# Patient Record
Sex: Male | Born: 1945 | Race: White | Hispanic: No | Marital: Married | Smoking: Never smoker
Health system: Southern US, Community
[De-identification: ages and names within clinical notes are randomized; demographics above are authoritative.]

## PROBLEM LIST (undated history)

## (undated) DIAGNOSIS — F419 Anxiety disorder, unspecified: Secondary | ICD-10-CM

## (undated) DIAGNOSIS — M109 Gout, unspecified: Secondary | ICD-10-CM

## (undated) DIAGNOSIS — H8110 Benign paroxysmal vertigo, unspecified ear: Secondary | ICD-10-CM

## (undated) DIAGNOSIS — D432 Neoplasm of uncertain behavior of brain, unspecified: Secondary | ICD-10-CM

## (undated) DIAGNOSIS — G4730 Sleep apnea, unspecified: Secondary | ICD-10-CM

## (undated) DIAGNOSIS — D496 Neoplasm of unspecified behavior of brain: Secondary | ICD-10-CM

## (undated) DIAGNOSIS — I10 Essential (primary) hypertension: Secondary | ICD-10-CM

## (undated) DIAGNOSIS — F32A Depression, unspecified: Secondary | ICD-10-CM

## (undated) DIAGNOSIS — F329 Major depressive disorder, single episode, unspecified: Secondary | ICD-10-CM

## (undated) HISTORY — DX: Benign paroxysmal vertigo, unspecified ear: H81.10

## (undated) HISTORY — PX: KNEE ARTHROSCOPY: SHX127

## (undated) HISTORY — PX: BRAIN SURGERY: SHX531

## (undated) HISTORY — PX: JOINT REPLACEMENT: SHX530

## (undated) HISTORY — DX: Neoplasm of uncertain behavior of brain, unspecified: D43.2

## (undated) HISTORY — DX: Neoplasm of unspecified behavior of brain: D49.6

---

## 2000-11-10 ENCOUNTER — Ambulatory Visit (HOSPITAL_COMMUNITY): Admission: RE | Admit: 2000-11-10 | Discharge: 2000-11-10 | Payer: Self-pay | Admitting: Neurological Surgery

## 2000-11-10 ENCOUNTER — Encounter: Payer: Self-pay | Admitting: Neurological Surgery

## 2001-05-25 ENCOUNTER — Ambulatory Visit (HOSPITAL_COMMUNITY): Admission: RE | Admit: 2001-05-25 | Discharge: 2001-05-25 | Payer: Self-pay | Admitting: Family Medicine

## 2001-05-25 ENCOUNTER — Encounter: Payer: Self-pay | Admitting: Family Medicine

## 2002-02-25 ENCOUNTER — Encounter: Admission: RE | Admit: 2002-02-25 | Discharge: 2002-02-25 | Payer: Self-pay | Admitting: Orthopedic Surgery

## 2002-02-25 ENCOUNTER — Encounter: Payer: Self-pay | Admitting: Orthopedic Surgery

## 2002-11-12 ENCOUNTER — Ambulatory Visit (HOSPITAL_COMMUNITY): Admission: RE | Admit: 2002-11-12 | Discharge: 2002-11-12 | Payer: Self-pay | Admitting: Neurological Surgery

## 2003-02-03 ENCOUNTER — Encounter: Admission: RE | Admit: 2003-02-03 | Discharge: 2003-02-03 | Payer: Self-pay | Admitting: Orthopedic Surgery

## 2003-03-13 ENCOUNTER — Inpatient Hospital Stay (HOSPITAL_COMMUNITY): Admission: RE | Admit: 2003-03-13 | Discharge: 2003-03-15 | Payer: Self-pay | Admitting: Orthopedic Surgery

## 2003-06-20 ENCOUNTER — Ambulatory Visit (HOSPITAL_COMMUNITY): Admission: RE | Admit: 2003-06-20 | Discharge: 2003-06-20 | Payer: Self-pay | Admitting: Sports Medicine

## 2005-04-17 ENCOUNTER — Emergency Department (HOSPITAL_COMMUNITY): Admission: EM | Admit: 2005-04-17 | Discharge: 2005-04-17 | Payer: Self-pay | Admitting: Emergency Medicine

## 2005-06-13 ENCOUNTER — Ambulatory Visit (HOSPITAL_COMMUNITY): Admission: RE | Admit: 2005-06-13 | Discharge: 2005-06-13 | Payer: Self-pay | Admitting: Neurological Surgery

## 2006-10-25 ENCOUNTER — Ambulatory Visit (HOSPITAL_COMMUNITY): Admission: RE | Admit: 2006-10-25 | Discharge: 2006-10-25 | Payer: Self-pay | Admitting: General Surgery

## 2007-02-08 ENCOUNTER — Ambulatory Visit (HOSPITAL_BASED_OUTPATIENT_CLINIC_OR_DEPARTMENT_OTHER): Admission: RE | Admit: 2007-02-08 | Discharge: 2007-02-08 | Payer: Self-pay | Admitting: Orthopedic Surgery

## 2009-01-21 ENCOUNTER — Encounter (HOSPITAL_COMMUNITY): Admission: RE | Admit: 2009-01-21 | Discharge: 2009-02-20 | Payer: Self-pay | Admitting: Oncology

## 2009-01-21 ENCOUNTER — Ambulatory Visit (HOSPITAL_COMMUNITY): Payer: Self-pay | Admitting: Oncology

## 2009-03-16 ENCOUNTER — Ambulatory Visit (HOSPITAL_COMMUNITY): Payer: Self-pay | Admitting: Oncology

## 2009-03-16 ENCOUNTER — Encounter (HOSPITAL_COMMUNITY): Admission: RE | Admit: 2009-03-16 | Discharge: 2009-04-15 | Payer: Self-pay | Admitting: Oncology

## 2009-10-27 ENCOUNTER — Ambulatory Visit (HOSPITAL_COMMUNITY): Payer: Self-pay | Admitting: Oncology

## 2009-10-27 ENCOUNTER — Encounter (HOSPITAL_COMMUNITY)
Admission: RE | Admit: 2009-10-27 | Discharge: 2009-11-26 | Payer: Self-pay | Source: Home / Self Care | Admitting: Oncology

## 2010-03-17 LAB — TESTOSTERONE: Testosterone: 178.47 ng/dL — ABNORMAL LOW (ref 250–890)

## 2010-03-17 LAB — DIFFERENTIAL
Basophils Absolute: 0 10*3/uL (ref 0.0–0.1)
Basophils Relative: 1 % (ref 0–1)
Eosinophils Absolute: 0.2 10*3/uL (ref 0.0–0.7)
Lymphocytes Relative: 28 % (ref 12–46)
Lymphs Abs: 2.4 10*3/uL (ref 0.7–4.0)
Monocytes Relative: 7 % (ref 3–12)
Neutro Abs: 5.3 10*3/uL (ref 1.7–7.7)

## 2010-03-17 LAB — CBC
HCT: 43 % (ref 39.0–52.0)
Hemoglobin: 14.5 g/dL (ref 13.0–17.0)
RDW: 13.6 % (ref 11.5–15.5)

## 2010-03-17 LAB — LIPID PANEL
Cholesterol: 169 mg/dL (ref 0–200)
LDL Cholesterol: 116 mg/dL — ABNORMAL HIGH (ref 0–99)

## 2010-03-17 LAB — TESTOSTERONE, % FREE: Testosterone-% Free: 2.2 % — ABNORMAL HIGH (ref 1.6–2.9)

## 2010-03-19 ENCOUNTER — Other Ambulatory Visit (HOSPITAL_COMMUNITY): Payer: Self-pay | Admitting: Internal Medicine

## 2010-03-19 ENCOUNTER — Ambulatory Visit (HOSPITAL_COMMUNITY)
Admission: RE | Admit: 2010-03-19 | Discharge: 2010-03-19 | Disposition: A | Discharge status: Home / Self Care | Payer: BC Managed Care – PPO | Source: Ambulatory Visit | Attending: Internal Medicine | Admitting: Internal Medicine

## 2010-03-19 DIAGNOSIS — R609 Edema, unspecified: Secondary | ICD-10-CM

## 2010-03-19 DIAGNOSIS — M79609 Pain in unspecified limb: Secondary | ICD-10-CM | POA: Insufficient documentation

## 2010-03-19 DIAGNOSIS — M79675 Pain in left toe(s): Secondary | ICD-10-CM

## 2010-03-19 DIAGNOSIS — M7989 Other specified soft tissue disorders: Secondary | ICD-10-CM | POA: Insufficient documentation

## 2010-03-22 LAB — DIFFERENTIAL
Basophils Absolute: 0.1 10*3/uL (ref 0.0–0.1)
Basophils Relative: 1 % (ref 0–1)
Eosinophils Absolute: 0.4 10*3/uL (ref 0.0–0.7)
Lymphs Abs: 2.9 10*3/uL (ref 0.7–4.0)
Monocytes Absolute: 0.7 10*3/uL (ref 0.1–1.0)
Neutro Abs: 6.6 10*3/uL (ref 1.7–7.7)

## 2010-03-22 LAB — CBC
MCV: 89.9 fL (ref 78.0–100.0)
Platelets: 223 10*3/uL (ref 150–400)
RBC: 4.57 MIL/uL (ref 4.22–5.81)
WBC: 10.7 10*3/uL — ABNORMAL HIGH (ref 4.0–10.5)

## 2010-03-29 LAB — DIFFERENTIAL
Basophils Absolute: 0 10*3/uL (ref 0.0–0.1)
Basophils Relative: 0 % (ref 0–1)
Lymphocytes Relative: 20 % (ref 12–46)
Monocytes Absolute: 0.8 10*3/uL (ref 0.1–1.0)
Neutro Abs: 9.6 10*3/uL — ABNORMAL HIGH (ref 1.7–7.7)

## 2010-03-29 LAB — CBC
HCT: 41.4 % (ref 39.0–52.0)
RDW: 13.9 % (ref 11.5–15.5)
WBC: 13.4 10*3/uL — ABNORMAL HIGH (ref 4.0–10.5)

## 2010-05-18 NOTE — H&P (Signed)
Mark Bowers, Mark Bowers              ACCOUNT NO.:  [id number]   MEDICAL RECORD NO.:  [id number]           PATIENT TYPE:  AMB   LOCATION:  DAY                           FACILITY:  [facility]   PHYSICIAN:  Dalia Heading, M.D.  DATE OF BIRTH:  1945-08-24   DATE OF ADMISSION:  10/25/2006  DATE OF DISCHARGE:  LH                              HISTORY & PHYSICAL   CHIEF COMPLAINT:  Need for screening colonoscopy.   HISTORY OF PRESENT ILLNESS:  The patient is a 65 year old white male  who is referred for endoscopic evaluation.  He needs a colonoscopy for  screening purposes.  No abdominal pain, weight loss, nausea, vomiting,  diarrhea, constipation, melena or hematochezia have been noted.  He has  never had a colonoscopy.  There is no family history of colon carcinoma.   PAST MEDICAL HISTORY:  1. Depression.  2. Hypertension.   PAST SURGICAL HISTORY:  1. Knee surgery.  2. Brain surgery.  3. Right hip surgery.   CURRENT MEDICATIONS:  Effexor, aldactone, Crestor.   ALLERGIES:  PENICILLIN, SULFA.   REVIEW OF SYSTEMS:  The patient denies smoking.  He drinks alcohol  socially.  No other cardiopulmonary difficulties or bleeding disorders  are noted.   PHYSICAL EXAMINATION:  The patient is a well developed, well nourished  white male in no acute distress.  LUNGS:  Clear to auscultation with equal breath sounds bilaterally.  HEART:  Examination reveals a regular rate and rhythm, without S3, S4 or  murmurs.  ABDOMEN:  Soft, nontender, nondistended.  No hepatosplenomegaly or  masses are noted.  RECTAL:  Examination was deferred to the procedure.   IMPRESSION:  Need for screening colonoscopy.   PLAN:  The patient is scheduled for a colonoscopy on October 25, 2006.  The risks and benefits of the procedure, including bleeding and  perforation were fully explained to the patient.  He gave informed  consent.      Dalia Heading, M.D.  Electronically Signed     MAJ/MEDQ  D:  10/19/2006   T:  10/19/2006  Job:  [id number]   cc:   Kirk Ruths, M.D.  Fax: [phone]

## 2010-05-18 NOTE — H&P (Signed)
NAMEGOTTFRIED, STANDISH              ACCOUNT NO.:  [id number]   MEDICAL RECORD NO.:  [id number]           PATIENT TYPE:  AMB   LOCATION:  DAY                           FACILITY:  [facility]   PHYSICIAN:  Dalia Heading, M.D.  DATE OF BIRTH:  1945/05/17   DATE OF ADMISSION:  10/25/2006  DATE OF DISCHARGE:  LH                              HISTORY & PHYSICAL   CHIEF COMPLAINT:  Need for screening colonoscopy.   HISTORY OF PRESENT ILLNESS:  The patient is a 65 year old white male  who is referred for endoscopic evaluation.  He needs a colonoscopy for  screening purposes.  No abdominal pain, weight loss, nausea, vomiting,  diarrhea, constipation, melena or hematochezia have been noted.  He has  never had a colonoscopy.  There is no family history of colon carcinoma.   PAST MEDICAL HISTORY:  1. Depression.  2. Hypertension.   PAST SURGICAL HISTORY:  1. Knee surgery.  2. Brain surgery.  3. Right hip surgery.   CURRENT MEDICATIONS:  Effexor, aldactone, Crestor.   ALLERGIES:  PENICILLIN, SULFA.   REVIEW OF SYSTEMS:  The patient denies smoking.  He drinks alcohol  socially.  No other cardiopulmonary difficulties or bleeding disorders  are noted.   PHYSICAL EXAMINATION:  The patient is a well developed, well nourished  white male in no acute distress.  LUNGS:  Clear to auscultation with equal breath sounds bilaterally.  HEART:  Examination reveals a regular rate and rhythm, without S3, S4 or  murmurs.  ABDOMEN:  Soft, nontender, nondistended.  No hepatosplenomegaly or  masses are noted.  RECTAL:  Examination was deferred to the procedure.   IMPRESSION:  Need for screening colonoscopy.   PLAN:  The patient is scheduled for a colonoscopy on October 25, 2006.  The risks and benefits of the procedure, including bleeding and  perforation were fully explained to the patient.  He gave informed  consent.      Dalia Heading, M.D.  Electronically Signed     MAJ/MEDQ  D:  10/19/2006   T:  10/19/2006  Job:  [id number]   cc:   Kirk Ruths, M.D.  Fax: [phone]

## 2010-05-18 NOTE — Op Note (Signed)
[name], Mark Bowers NO.:  [id number]   MEDICAL RECORD NO.:  [id number]          PATIENT TYPE:  AMB   LOCATION:  DSC                          FACILITY:  [facility]   PHYSICIAN:  Loreta Ave, M.D. DATE OF BIRTH:  1945-05-20   DATE OF PROCEDURE:  02/08/2007  DATE OF DISCHARGE:                               OPERATIVE REPORT   PREOPERATIVE DIAGNOSIS:  Left knee medial meniscus tear.   POSTOPERATIVE DIAGNOSIS:  Left knee medial and lateral meniscal tears  with grade 3 chondromalacia patella, medial femoral condyle, lateral  tibial plateau with chondral loose bodies.   PROCEDURE:  Left knee exam under anesthesia, arthroscopy with partial  medial meniscectomies.  Chondroplasty all three compartments, including  patella.   SURGEON:  Loreta Ave, M.D.   ASSISTANT:  Zonia Kief, PA.   ANESTHESIA:  General.   SPECIMENS:  None.   COMPLICATIONS:  None.   DRESSING:  Soft compressive.   TOURNIQUET:  Not employed.   PROCEDURE:  The patient brought to the operating room, placed on the  operating table in the supine position.  After adequate anesthesia had  been obtained, tourniquet and leg holder applied.  Leg prepped and  draped in usual sterile fashion.  Three portals created, one  superolateral, one each medial and lateral parapatellar.  Inflow  catheter introduced, knee distended, arthroscope introduced and knee  inspected.  Good patellofemoral tracking.  Grade 3 changes peak of the  patella, lateral facet debrided.  Trochlea only had mild changes.  A  large fibrotic medial plica excised.  Cruciate ligaments intact.  Medial  meniscus extensive complex tearing numerous flaps fragments.  The entire  posterior aspect removed tapered into remaining meniscus, salvaging most  of the anterior half.  Grade 3 changes relatively deep on the  weightbearing dome of the condyle debrided to a stable surface.  Nothing  grade 4.  Laterally, there was a large displaced  flap tear of the  posterior horn lateral meniscus removed, tapered in smoothly.  Remaining  small radial tears debrided.  Some grade II and III changes on the  plateau debrided.  Cruciate ligaments were intact to inspection and  probing and Lachman's.  The entire knee examined to be sure all  fragments were  removed.  Instruments and fluid removed.  Portals of the knee injected  with Marcaine.  Portals closed with 4-0 nylon.  Sterile compressive  dressing applied.  Anesthesia reversed.  Brought to the recovery room.  Tolerated surgery well.  No complications.      Loreta Ave, M.D.  Electronically Signed     DFM/MEDQ  D:  02/08/2007  T:  02/09/2007  Job:  [id number]

## 2010-05-21 NOTE — Discharge Summary (Signed)
Mark Bowers, Mark Bowers                        ACCOUNT NO.:  [id number]   MEDICAL RECORD NO.:  [id number]                   PATIENT TYPE:  INP   LOCATION:  5020                                 FACILITY:  [facility]   PHYSICIAN:  Loreta Ave, M.D.              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   DISCHARGE DIAGNOSES:  1. 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  2. Mitral valve disorder.  3. Hypertension.   PROCEDURE:  Right total hip replacement.   HISTORY OF PRESENT ILLNESS:  A 65 year old male with progressive right groin  pain and knee pain.  He is now having decreased range of motion of his right  hip with worsening pain.  Difficulties with ADLs at this time secondary to  pain as well as night time pain.  He has had injections x2 to the right  hip  which has been beneficial.  He is admitted at this time for total hip  replacement.   HOSPITAL COURSE:  A 65 year old white male admitted March 13, 2003, after  appropriate laboratory studies were obtained, was taken to the operating  room where he underwent a right total hip replacement.  He was placed  preoperatively and postoperatively on vancomycin 1 g IV q.12h.  A Dilaudid  high dose PCA pump was used for postoperative pain management.  Foley was  placed intraoperatively.  Consultation with PT and OT were made.  Heparin  5000 units subcu q.12h. until Coumadin became therapeutic.  He was allowed  to be touchdown weightbearing in PT.  He was allowed out of bed to a chair  the following day.  His PCA was discontinued and the IV was Hep-Locked.  The  remainder of his hospital course was uneventful and he requested discharge  on March 15, 2003.  He was discharged in improved condition.   EKG revealed normal sinus rhythm.   Chest  x-ray revealed stable examination without acute change.  Prominent  hilar vascular markings.  Right hip revealed bipolar hip prosthesis in  appropriate position.  No evidence for pressure or dislocation.   LABORATORY DATA:  Admitted with hemoglobin of 15.1, hematocrit 43.8%, white  count 9900, platelets 272,000. Discharge hemoglobin 11.1, hematocrit 33.0%,  white count 16,200, platelets 256,000.  Protime at the time of discharge was  14.8 with INR of 1.2.  Chemistries:  Admitted with sodium of 139, potassium  4.1, chloride 105, CO2 28, glucose 95, BUN 11, creatinine 1.1, calcium 9.3,  total protein 6.7, albumin 4.0, AST19, ALT 21, ALP 63, and bilirubin 0.8.  Discharge sodium 134, potassium 3.6, chloride 102, CO2 27, glucose 141, BUN  8, creatinine 0.9, and calcium 8.3.  His cholesterol was 173, triglycerides  133, HDL 39, ratio  cholesterol:HDL was 4.4, VLDL cholesterol was 27, and LDL  was 107.  Urinalysis was benign for voided urine.  He was blood type A  positive, antibody screen negative.   DISCHARGE INSTRUCTIONS:  1. He was given a prescription for Percocet 5/325 one to two tablets every     four hours as needed for pain.  2. Coumadin 5 mg as directed by Genevieve Norlander.  3. Lovenox injection twice a day until stopped by [place].  4. Iron sulfate 325 b.i.d.  5. Colace 100 mg b.i.d.  6. Activity:  As taught in physical therapy.  7. No restrictions on diet.  8. Keep his wound clean and dry and change dressing daily.  9. He will follow the blue instruction sheet.  10.      Call for an appointment for 10 to 14 days postoperatively.   Discharged in improved condition.      Oris Drone Petrarca, P.A.-C.                Loreta Ave, M.D.    BDP/MEDQ  D:  04/19/2003  T:  04/21/2003  Job:  [id number]

## 2010-06-26 ENCOUNTER — Emergency Department (HOSPITAL_COMMUNITY): Payer: BC Managed Care – PPO

## 2010-06-26 ENCOUNTER — Emergency Department (HOSPITAL_COMMUNITY)
Admission: EM | Admit: 2010-06-26 | Discharge: 2010-06-26 | Disposition: A | Discharge status: Home / Self Care | Payer: BC Managed Care – PPO | Attending: Emergency Medicine | Admitting: Emergency Medicine

## 2010-06-26 DIAGNOSIS — X58XXXA Exposure to other specified factors, initial encounter: Secondary | ICD-10-CM | POA: Insufficient documentation

## 2010-06-26 DIAGNOSIS — S71009A Unspecified open wound, unspecified hip, initial encounter: Secondary | ICD-10-CM | POA: Insufficient documentation

## 2010-06-26 DIAGNOSIS — S71109A Unspecified open wound, unspecified thigh, initial encounter: Secondary | ICD-10-CM | POA: Insufficient documentation

## 2010-09-24 LAB — BASIC METABOLIC PANEL
BUN: 15
CO2: 25
Chloride: 107
GFR calc Af Amer: >60
GFR calc non Af Amer: >60
Glucose, Bld: 135 — ABNORMAL HIGH
Sodium: 139

## 2011-02-24 ENCOUNTER — Ambulatory Visit (HOSPITAL_COMMUNITY)
Admission: RE | Admit: 2011-02-24 | Discharge: 2011-02-24 | Disposition: A | Discharge status: Home / Self Care | Payer: BC Managed Care – PPO | Source: Ambulatory Visit | Attending: Family Medicine | Admitting: Family Medicine

## 2011-02-24 ENCOUNTER — Other Ambulatory Visit (HOSPITAL_COMMUNITY): Payer: Self-pay | Admitting: Family Medicine

## 2011-02-24 DIAGNOSIS — M898X9 Other specified disorders of bone, unspecified site: Secondary | ICD-10-CM | POA: Insufficient documentation

## 2011-02-24 DIAGNOSIS — X500XXA Overexertion from strenuous movement or load, initial encounter: Secondary | ICD-10-CM | POA: Insufficient documentation

## 2011-02-24 DIAGNOSIS — S8990XA Unspecified injury of unspecified lower leg, initial encounter: Secondary | ICD-10-CM | POA: Insufficient documentation

## 2011-02-24 DIAGNOSIS — M779 Enthesopathy, unspecified: Secondary | ICD-10-CM

## 2011-02-24 DIAGNOSIS — M25579 Pain in unspecified ankle and joints of unspecified foot: Secondary | ICD-10-CM | POA: Insufficient documentation

## 2011-02-24 DIAGNOSIS — S99929A Unspecified injury of unspecified foot, initial encounter: Secondary | ICD-10-CM | POA: Insufficient documentation

## 2012-06-27 ENCOUNTER — Other Ambulatory Visit: Payer: Self-pay | Admitting: Orthopedic Surgery

## 2012-07-02 ENCOUNTER — Ambulatory Visit (HOSPITAL_COMMUNITY)
Admission: RE | Admit: 2012-07-02 | Discharge: 2012-07-02 | Disposition: A | Discharge status: Home / Self Care | Payer: Medicare Other | Source: Ambulatory Visit | Attending: Orthopedic Surgery | Admitting: Orthopedic Surgery

## 2012-07-02 ENCOUNTER — Encounter (HOSPITAL_COMMUNITY): Payer: Self-pay

## 2012-07-02 ENCOUNTER — Encounter (HOSPITAL_COMMUNITY)
Admission: RE | Admit: 2012-07-02 | Discharge: 2012-07-02 | Disposition: A | Discharge status: Home / Self Care | Payer: Medicare Other | Source: Ambulatory Visit | Attending: Orthopedic Surgery | Admitting: Orthopedic Surgery

## 2012-07-02 ENCOUNTER — Encounter (HOSPITAL_COMMUNITY): Payer: Self-pay | Admitting: Pharmacy Technician

## 2012-07-02 DIAGNOSIS — Z01812 Encounter for preprocedural laboratory examination: Secondary | ICD-10-CM | POA: Insufficient documentation

## 2012-07-02 DIAGNOSIS — Z01818 Encounter for other preprocedural examination: Secondary | ICD-10-CM | POA: Insufficient documentation

## 2012-07-02 DIAGNOSIS — M1990 Unspecified osteoarthritis, unspecified site: Secondary | ICD-10-CM | POA: Insufficient documentation

## 2012-07-02 DIAGNOSIS — Z0183 Encounter for blood typing: Secondary | ICD-10-CM | POA: Insufficient documentation

## 2012-07-02 DIAGNOSIS — J9819 Other pulmonary collapse: Secondary | ICD-10-CM | POA: Insufficient documentation

## 2012-07-02 DIAGNOSIS — Z01810 Encounter for preprocedural cardiovascular examination: Secondary | ICD-10-CM | POA: Insufficient documentation

## 2012-07-02 HISTORY — DX: Anxiety disorder, unspecified: F41.9

## 2012-07-02 HISTORY — DX: Major depressive disorder, single episode, unspecified: F32.9

## 2012-07-02 HISTORY — DX: Gout, unspecified: M10.9

## 2012-07-02 HISTORY — DX: Essential (primary) hypertension: I10

## 2012-07-02 HISTORY — DX: Sleep apnea, unspecified: G47.30

## 2012-07-02 HISTORY — DX: Depression, unspecified: F32.A

## 2012-07-02 LAB — CBC
MCH: 30.6 pg (ref 26.0–34.0)
MCHC: 35 g/dL (ref 30.0–36.0)
MCV: 87.4 fL (ref 78.0–100.0)
Platelets: 209 10*3/uL (ref 150–400)
RDW: 12.9 % (ref 11.5–15.5)

## 2012-07-02 LAB — URINALYSIS, ROUTINE W REFLEX MICROSCOPIC
Bilirubin Urine: NEGATIVE
Hgb urine dipstick: NEGATIVE
Specific Gravity, Urine: 1.022 (ref 1.005–1.030)
Urobilinogen, UA: 0.2 mg/dL (ref 0.0–1.0)
pH: 5 (ref 5.0–8.0)

## 2012-07-02 LAB — BASIC METABOLIC PANEL
BUN: 21 mg/dL (ref 6–23)
CO2: 25 mEq/L (ref 19–32)
Calcium: 9 mg/dL (ref 8.4–10.5)
Creatinine, Ser: 1.1 mg/dL (ref 0.50–1.35)
GFR calc non Af Amer: 69 mL/min — ABNORMAL LOW (ref >90)
Glucose, Bld: 87 mg/dL (ref 70–99)
Sodium: 139 mEq/L (ref 135–145)

## 2012-07-02 LAB — SURGICAL PCR SCREEN
MRSA, PCR: NEGATIVE
Staphylococcus aureus: NEGATIVE

## 2012-07-02 LAB — ABO/RH: ABO/RH(D): A POS

## 2012-07-02 LAB — PROTIME-INR: Prothrombin Time: 12.5 seconds (ref 11.6–15.2)

## 2012-07-02 NOTE — Pre-Procedure Instructions (Signed)
Mark Bowers  07/02/2012   Your procedure is scheduled on:  07/11/12  Report to [facility] at 630 AM.  Call this number if you have problems the morning of surgery: [phone]   Remember:   Do not eat food or drink liquids after midnight.   Take these medicines the morning of surgery with A SIP OF WATER: aldactone,effexior, colchicine   Do not wear jewelry, make-up or nail polish.  Do not wear lotions, powders, or perfumes. You may wear deodorant.  Do not shave 48 hours prior to surgery. Men may shave face and neck.  Do not bring valuables to the hospital.  [facility] is not responsible                   for any belongings or valuables.  Contacts, dentures or bridgework may not be worn into surgery.  Leave suitcase in the car. After surgery it may be brought to your room.  For patients admitted to the hospital, checkout time is 11:00 AM the day of  discharge.   Patients discharged the day of surgery will not be allowed to drive  home.  Name and phone number of your driver: family  Special Instructions: Shower using CHG 2 nights before surgery and the night before surgery.  If you shower the day of surgery use CHG.  Use special wash - you have one bottle of CHG for all showers.  You should use approximately 1/3 of the bottle for each shower.   Please read over the following fact sheets that you were given: Pain Booklet, Coughing and Deep Breathing, Blood Transfusion Information, MRSA Information and Surgical Site Infection Prevention

## 2012-07-08 ENCOUNTER — Other Ambulatory Visit: Payer: Self-pay | Admitting: Orthopedic Surgery

## 2012-07-10 NOTE — H&P (Signed)
  [facility] [address]   [address] [place], [place] [address] [phone] A Division of [facility]  Loreta Ave, M.D.   Darry A. Thurston Hole, M.D.   Burnell Blanks, M.D.   Eulas Post, M.D.   Lunette Stands, M.D Buford Dresser, M.D.  Charlsie Quest, M.D.   Estell Harpin, M.D.   Melina Fiddler, M.D. Genene Churn. Barry Dienes, PA-C            Kirstin A. Shepperson, PA-C Josh Riverdale, PA-C [place], [place]   RE: [place], [place]   [id number]      DOB: May 17, 1945 PROGRESS NOTE: 06-26-12 67 year old with long history of left hip degenerative joint disease. He had right total hip replacement and is doing well. He has left hip pain with limited range of motion and it interferes with rest. He has night pain and interrupted sleep. He has had an increased Trendelenburg gait. He has failed conservative treatment. Current medications Aldactone, Effexor, Crestor, and testosterone. Drug allergy to penicillin and sulfa. Past medical history: positive for sleep apnea.  Family history is positive for diabetes stroke and cancer. Past surgical history significant for brain tumor in 1994 97 and 98 right total hip replacement in 2005 and left knee arthroscopy 2007.  Social history: he is married lives with his wife. He does not use tobacco, consumes alcohol twice a week.  Review of systems: no recent dizziness light headedness fever chills cardiac pulmonary GI GU or neuro issues.   EXAMINATION: Height 5'10" 222 pounds. BMI: 32.6. Blood pressure 143/90 pulse is 73 temp is 97. Alert and oriented no acute distress. He has  Trendelenburg gait. Head is normal cephalic atraumatic. PERRLA. Extraocular motion is intact. Lungs clear to auscultation. Heart: regular rate and rhythm no murmur rubs or gallops. Abdomen is soft non-tender and non-distended. Right hip surgical wounds are well approximated and healed good range of motion no pain. Left hip  has decreased internal and external rotation positive log roll pain forward flexion 90 degrees otherwise neurovascularly intact.  X-RAYS: X-rays with sizing markers show bone on bone left hip degenerative joint disease. Right total hip replacement in good position and alignment.   IMPRESSION: Left hip end stage degenerative joint disease with chronic pain failed conservative treatment.  PLAN: He wants to proceed with left total hip replacement. Discussed risks benefits and possible complications and questions answered. He will follow-up post-op.  Loreta Ave, M.D.  Electronically verified by Loreta Ave, M.D. DFM(BR):kah D 07-09-12 T 07-09-12

## 2012-07-11 ENCOUNTER — Inpatient Hospital Stay (HOSPITAL_COMMUNITY): Payer: Medicare Other

## 2012-07-11 ENCOUNTER — Encounter (HOSPITAL_COMMUNITY): Payer: Self-pay

## 2012-07-11 ENCOUNTER — Ambulatory Visit (HOSPITAL_COMMUNITY): Payer: Medicare Other | Admitting: Anesthesiology

## 2012-07-11 ENCOUNTER — Encounter (HOSPITAL_COMMUNITY): Payer: Self-pay | Admitting: Anesthesiology

## 2012-07-11 ENCOUNTER — Inpatient Hospital Stay (HOSPITAL_COMMUNITY)
Admission: RE | Admit: 2012-07-11 | Discharge: 2012-07-12 | DRG: 470 | Disposition: A | Discharge status: Home Health | Payer: Medicare Other | Source: Ambulatory Visit | Attending: Orthopedic Surgery | Admitting: Orthopedic Surgery

## 2012-07-11 ENCOUNTER — Encounter (HOSPITAL_COMMUNITY)
Admission: RE | Disposition: A | Discharge status: Home Health | Payer: Self-pay | Source: Ambulatory Visit | Attending: Orthopedic Surgery

## 2012-07-11 DIAGNOSIS — Z6841 Body Mass Index (BMI) 40.0 and over, adult: Secondary | ICD-10-CM

## 2012-07-11 DIAGNOSIS — G4730 Sleep apnea, unspecified: Secondary | ICD-10-CM | POA: Diagnosis present

## 2012-07-11 DIAGNOSIS — M169 Osteoarthritis of hip, unspecified: Principal | ICD-10-CM | POA: Diagnosis present

## 2012-07-11 DIAGNOSIS — Z880 Allergy status to penicillin: Secondary | ICD-10-CM

## 2012-07-11 DIAGNOSIS — M1612 Unilateral primary osteoarthritis, left hip: Secondary | ICD-10-CM | POA: Diagnosis present

## 2012-07-11 DIAGNOSIS — Z79899 Other long term (current) drug therapy: Secondary | ICD-10-CM

## 2012-07-11 DIAGNOSIS — G8929 Other chronic pain: Secondary | ICD-10-CM | POA: Diagnosis present

## 2012-07-11 DIAGNOSIS — M1610 Unilateral primary osteoarthritis, unspecified hip: Principal | ICD-10-CM | POA: Diagnosis present

## 2012-07-11 DIAGNOSIS — Z882 Allergy status to sulfonamides status: Secondary | ICD-10-CM

## 2012-07-11 DIAGNOSIS — Z823 Family history of stroke: Secondary | ICD-10-CM

## 2012-07-11 DIAGNOSIS — Z96649 Presence of unspecified artificial hip joint: Secondary | ICD-10-CM

## 2012-07-11 DIAGNOSIS — Z833 Family history of diabetes mellitus: Secondary | ICD-10-CM

## 2012-07-11 DIAGNOSIS — Z7901 Long term (current) use of anticoagulants: Secondary | ICD-10-CM

## 2012-07-11 HISTORY — PX: TOTAL HIP ARTHROPLASTY: SHX124

## 2012-07-11 LAB — CREATININE, SERUM
Creatinine, Ser: 1.1 mg/dL (ref 0.50–1.35)
GFR calc Af Amer: 79 mL/min — ABNORMAL LOW (ref >90)
GFR calc non Af Amer: 69 mL/min — ABNORMAL LOW (ref >90)

## 2012-07-11 LAB — CBC
MCHC: 34.2 g/dL (ref 30.0–36.0)
Platelets: 188 10*3/uL (ref 150–400)
RDW: 13.4 % (ref 11.5–15.5)
WBC: 20.2 10*3/uL — ABNORMAL HIGH (ref 4.0–10.5)

## 2012-07-11 SURGERY — ARTHROPLASTY, HIP, TOTAL,POSTERIOR APPROACH
Anesthesia: General | Site: Hip | Laterality: Left | Wound class: Clean

## 2012-07-11 MED ORDER — ACETAMINOPHEN 325 MG PO TABS: 650.0000 mg | ORAL_TABLET | Freq: Four times a day (QID) | ORAL | Status: DC | PRN

## 2012-07-11 MED ORDER — HYDROMORPHONE HCL PF 1 MG/ML IJ SOLN
INTRAMUSCULAR | Status: AC
  Filled 2012-07-11: qty 1

## 2012-07-11 MED ORDER — HYDROMORPHONE HCL PF 1 MG/ML IJ SOLN
0.2500 mg | INTRAMUSCULAR | Status: DC | PRN
  Administered 2012-07-11 (×4): 0.5 mg via INTRAVENOUS

## 2012-07-11 MED ORDER — LIDOCAINE HCL (CARDIAC) 20 MG/ML IV SOLN
INTRAVENOUS | Status: DC | PRN
  Administered 2012-07-11: 100 mg via INTRAVENOUS

## 2012-07-11 MED ORDER — VENLAFAXINE HCL ER 75 MG PO CP24
75.0000 mg | ORAL_CAPSULE | Freq: Every day | ORAL | Status: DC
  Administered 2012-07-12: 75 mg via ORAL
  Filled 2012-07-11: qty 1

## 2012-07-11 MED ORDER — ACETAMINOPHEN 650 MG RE SUPP: 650.0000 mg | Freq: Four times a day (QID) | RECTAL | Status: DC | PRN

## 2012-07-11 MED ORDER — POTASSIUM CHLORIDE IN NACL 20-0.9 MEQ/L-% IV SOLN
INTRAVENOUS | Status: DC
  Administered 2012-07-11: 20:00:00 via INTRAVENOUS
  Filled 2012-07-11 (×4): qty 1000

## 2012-07-11 MED ORDER — PROPOFOL 10 MG/ML IV BOLUS
INTRAVENOUS | Status: DC | PRN
  Administered 2012-07-11: 200 mg via INTRAVENOUS

## 2012-07-11 MED ORDER — DEXAMETHASONE SODIUM PHOSPHATE 4 MG/ML IJ SOLN
INTRAMUSCULAR | Status: DC | PRN
  Administered 2012-07-11: 8 mg via INTRAVENOUS

## 2012-07-11 MED ORDER — WARFARIN - PHARMACIST DOSING INPATIENT: Freq: Every day | Status: DC

## 2012-07-11 MED ORDER — CEFAZOLIN SODIUM-DEXTROSE 2-3 GM-% IV SOLR
INTRAVENOUS | Status: AC
  Administered 2012-07-11: 2 g via INTRAVENOUS
  Filled 2012-07-11: qty 50

## 2012-07-11 MED ORDER — NEOSTIGMINE METHYLSULFATE 1 MG/ML IJ SOLN
INTRAMUSCULAR | Status: DC | PRN
  Administered 2012-07-11: 3 mg via INTRAVENOUS

## 2012-07-11 MED ORDER — OXYCODONE HCL 5 MG PO TABS: 5.0000 mg | ORAL_TABLET | Freq: Once | ORAL | Status: DC | PRN

## 2012-07-11 MED ORDER — ATORVASTATIN CALCIUM 40 MG PO TABS
40.0000 mg | ORAL_TABLET | Freq: Every day | ORAL | Status: DC
  Filled 2012-07-11 (×2): qty 1

## 2012-07-11 MED ORDER — COUMADIN BOOK
Freq: Once | Status: AC
  Administered 2012-07-11: 13:00:00
  Filled 2012-07-11: qty 1

## 2012-07-11 MED ORDER — SODIUM CHLORIDE 0.9 % IR SOLN
Status: DC | PRN
  Administered 2012-07-11: 1000 mL

## 2012-07-11 MED ORDER — PHENOL 1.4 % MT LIQD: 1.0000 | OROMUCOSAL | Status: DC | PRN

## 2012-07-11 MED ORDER — ENOXAPARIN SODIUM 40 MG/0.4ML ~~LOC~~ SOLN
40.0000 mg | SUBCUTANEOUS | Status: DC
  Administered 2012-07-12: 40 mg via SUBCUTANEOUS
  Filled 2012-07-11 (×2): qty 0.4

## 2012-07-11 MED ORDER — BUPIVACAINE LIPOSOME 1.3 % IJ SUSP
INTRAMUSCULAR | Status: DC | PRN
  Administered 2012-07-11: 20 mL

## 2012-07-11 MED ORDER — METOCLOPRAMIDE HCL 5 MG/ML IJ SOLN: 10.0000 mg | Freq: Once | INTRAMUSCULAR | Status: DC | PRN

## 2012-07-11 MED ORDER — CHLORHEXIDINE GLUCONATE 4 % EX LIQD: 60.0000 mL | Freq: Once | CUTANEOUS | Status: DC

## 2012-07-11 MED ORDER — GLYCOPYRROLATE 0.2 MG/ML IJ SOLN
INTRAMUSCULAR | Status: DC | PRN
  Administered 2012-07-11: 0.2 mg via INTRAVENOUS
  Administered 2012-07-11: 0.4 mg via INTRAVENOUS

## 2012-07-11 MED ORDER — SPIRONOLACTONE 25 MG PO TABS
25.0000 mg | ORAL_TABLET | Freq: Every day | ORAL | Status: DC
  Administered 2012-07-12: 25 mg via ORAL
  Filled 2012-07-11: qty 1

## 2012-07-11 MED ORDER — MIDAZOLAM HCL 5 MG/5ML IJ SOLN
INTRAMUSCULAR | Status: DC | PRN
  Administered 2012-07-11: 2 mg via INTRAVENOUS

## 2012-07-11 MED ORDER — FENTANYL CITRATE 0.05 MG/ML IJ SOLN
INTRAMUSCULAR | Status: DC | PRN
  Administered 2012-07-11 (×3): 50 ug via INTRAVENOUS
  Administered 2012-07-11: 100 ug via INTRAVENOUS

## 2012-07-11 MED ORDER — MENTHOL 3 MG MT LOZG: 1.0000 | LOZENGE | OROMUCOSAL | Status: DC | PRN

## 2012-07-11 MED ORDER — WARFARIN SODIUM 7.5 MG PO TABS
7.5000 mg | ORAL_TABLET | Freq: Once | ORAL | Status: AC
  Administered 2012-07-11: 7.5 mg via ORAL
  Filled 2012-07-11: qty 1

## 2012-07-11 MED ORDER — OXYCODONE-ACETAMINOPHEN 5-325 MG PO TABS
ORAL_TABLET | ORAL | Status: AC
  Filled 2012-07-11: qty 2

## 2012-07-11 MED ORDER — HYDROMORPHONE HCL PF 1 MG/ML IJ SOLN
0.5000 mg | INTRAMUSCULAR | Status: DC | PRN
Start: 2012-07-11 — End: 2012-07-12
  Administered 2012-07-11: 0.5 mg via INTRAVENOUS
  Filled 2012-07-11: qty 1

## 2012-07-11 MED ORDER — LACTATED RINGERS IV SOLN
INTRAVENOUS | Status: DC | PRN
  Administered 2012-07-11 (×3): via INTRAVENOUS

## 2012-07-11 MED ORDER — ONDANSETRON HCL 4 MG/2ML IJ SOLN: 4.0000 mg | Freq: Four times a day (QID) | INTRAMUSCULAR | Status: DC | PRN

## 2012-07-11 MED ORDER — COLCHICINE 0.6 MG PO TABS
0.6000 mg | ORAL_TABLET | Freq: Every day | ORAL | Status: DC | PRN
  Filled 2012-07-11: qty 1

## 2012-07-11 MED ORDER — ONDANSETRON HCL 4 MG PO TABS: 4.0000 mg | ORAL_TABLET | Freq: Four times a day (QID) | ORAL | Status: DC | PRN

## 2012-07-11 MED ORDER — BUPIVACAINE LIPOSOME 1.3 % IJ SUSP
20.0000 mL | Freq: Once | INTRAMUSCULAR | Status: DC
  Filled 2012-07-11: qty 20

## 2012-07-11 MED ORDER — OXYCODONE-ACETAMINOPHEN 5-325 MG PO TABS
1.0000 | ORAL_TABLET | ORAL | Status: DC | PRN
  Administered 2012-07-11 – 2012-07-12 (×7): 2 via ORAL
  Filled 2012-07-11 (×6): qty 2

## 2012-07-11 MED ORDER — OXYCODONE HCL 5 MG/5ML PO SOLN: 5.0000 mg | Freq: Once | ORAL | Status: DC | PRN

## 2012-07-11 MED ORDER — WARFARIN VIDEO: Freq: Once | Status: DC

## 2012-07-11 MED ORDER — LACTATED RINGERS IV SOLN: INTRAVENOUS | Status: DC

## 2012-07-11 MED ORDER — SODIUM CHLORIDE 0.9 % IJ SOLN
INTRAMUSCULAR | Status: DC | PRN
  Administered 2012-07-11: 40 mL via INTRAVENOUS

## 2012-07-11 MED ORDER — ONDANSETRON HCL 4 MG/2ML IJ SOLN
INTRAMUSCULAR | Status: DC | PRN
  Administered 2012-07-11: 4 mg via INTRAVENOUS

## 2012-07-11 MED ORDER — ROCURONIUM BROMIDE 100 MG/10ML IV SOLN
INTRAVENOUS | Status: DC | PRN
  Administered 2012-07-11: 50 mg via INTRAVENOUS

## 2012-07-11 MED ORDER — BUPIVACAINE-EPINEPHRINE 0.25% -1:200000 IJ SOLN
INTRAMUSCULAR | Status: DC | PRN
  Administered 2012-07-11: 10 mL

## 2012-07-11 MED ORDER — HYDROCODONE-ACETAMINOPHEN 7.5-325 MG PO TABS: 1.0000 | ORAL_TABLET | ORAL | Status: DC | PRN

## 2012-07-11 MED ORDER — ATORVASTATIN CALCIUM 40 MG PO TABS
40.0000 mg | ORAL_TABLET | ORAL | Status: DC
  Filled 2012-07-11: qty 1

## 2012-07-11 MED ORDER — ALUM & MAG HYDROXIDE-SIMETH 200-200-20 MG/5ML PO SUSP: 30.0000 mL | ORAL | Status: DC | PRN

## 2012-07-11 MED ORDER — CEFAZOLIN SODIUM 1-5 GM-% IV SOLN
1.0000 g | Freq: Four times a day (QID) | INTRAVENOUS | Status: AC
  Administered 2012-07-11 (×2): 1 g via INTRAVENOUS
  Filled 2012-07-11 (×2): qty 50

## 2012-07-11 MED ORDER — BUPIVACAINE-EPINEPHRINE PF 0.25-1:200000 % IJ SOLN
INTRAMUSCULAR | Status: AC
  Filled 2012-07-11: qty 30

## 2012-07-11 SURGICAL SUPPLY — 62 items
BLADE SAW SAG 73X25 THK (BLADE) ×1
BLADE SAW SGTL 73X25 THK (BLADE) ×1 IMPLANT
BOOTCOVER CLEANROOM LRG (PROTECTIVE WEAR) ×2 IMPLANT
BRUSH FEMORAL CANAL (MISCELLANEOUS) IMPLANT
CLOTH BEACON ORANGE TIMEOUT ST (SAFETY) ×2 IMPLANT
COVER BACK TABLE 24X17X13 BIG (DRAPES) IMPLANT
COVER SURGICAL LIGHT HANDLE (MISCELLANEOUS) ×2 IMPLANT
DRAPE INCISE IOBAN 66X45 STRL (DRAPES) IMPLANT
DRAPE ORTHO SPLIT 77X108 STRL (DRAPES) ×4
DRAPE SURG ORHT 6 SPLT 77X108 (DRAPES) ×2 IMPLANT
DRAPE U-SHAPE 47X51 STRL (DRAPES) ×2 IMPLANT
DRILL BIT 7/64X5 (BIT) ×2 IMPLANT
DRSG PAD ABDOMINAL 8X10 ST (GAUZE/BANDAGES/DRESSINGS) ×4 IMPLANT
DURAPREP 26ML APPLICATOR (WOUND CARE) ×2 IMPLANT
ELECT BLADE 6.5 EXT (BLADE) ×1 IMPLANT
ELECT CAUTERY BLADE 6.4 (BLADE) ×2 IMPLANT
ELECT REM PT RETURN 9FT ADLT (ELECTROSURGICAL) ×2
ELECTRODE REM PT RTRN 9FT ADLT (ELECTROSURGICAL) ×1 IMPLANT
EVACUATOR 1/8 PVC DRAIN (DRAIN) IMPLANT
FACESHIELD LNG OPTICON STERILE (SAFETY) ×3 IMPLANT
GAUZE XEROFORM 5X9 LF (GAUZE/BANDAGES/DRESSINGS) ×2 IMPLANT
GLOVE BIOGEL PI IND STRL 8 (GLOVE) ×1 IMPLANT
GLOVE BIOGEL PI INDICATOR 8 (GLOVE)
GLOVE ORTHO TXT STRL SZ7.5 (GLOVE) ×3 IMPLANT
GOWN PREVENTION PLUS XLARGE (GOWN DISPOSABLE) ×2 IMPLANT
GOWN STRL NON-REIN LRG LVL3 (GOWN DISPOSABLE) ×2 IMPLANT
GOWN STRL REIN 2XL XLG LVL4 (GOWN DISPOSABLE) ×2 IMPLANT
HANDPIECE INTERPULSE COAX TIP (DISPOSABLE)
HIP/CERM HD VIT E LINR LEV 1C ×1 IMPLANT
KIT BASIN OR (CUSTOM PROCEDURE TRAY) ×2 IMPLANT
KIT ROOM TURNOVER OR (KITS) ×2 IMPLANT
MANIFOLD NEPTUNE II (INSTRUMENTS) ×2 IMPLANT
NDL HYPO 25GX1X1/2 BEV (NEEDLE) ×1 IMPLANT
NEEDLE HYPO 25GX1X1/2 BEV (NEEDLE) IMPLANT
NS IRRIG 1000ML POUR BTL (IV SOLUTION) ×2 IMPLANT
PACK TOTAL JOINT (CUSTOM PROCEDURE TRAY) ×2 IMPLANT
PAD ARMBOARD 7.5X6 YLW CONV (MISCELLANEOUS) ×4 IMPLANT
PASSER SUT SWANSON 36MM LOOP (INSTRUMENTS) ×2 IMPLANT
PILLOW ABDUCTION HIP (SOFTGOODS) ×2 IMPLANT
PRESSURIZER FEMORAL UNIV (MISCELLANEOUS) IMPLANT
SET HNDPC FAN SPRY TIP SCT (DISPOSABLE) IMPLANT
SPONGE GAUZE 4X4 12PLY (GAUZE/BANDAGES/DRESSINGS) ×2 IMPLANT
SPONGE LAP 4X18 X RAY DECT (DISPOSABLE) ×1 IMPLANT
STAPLER VISISTAT 35W (STAPLE) ×2 IMPLANT
SUCTION FRAZIER TIP 10 FR DISP (SUCTIONS) ×2 IMPLANT
SUT FIBERWIRE #2 38 REV NDL BL (SUTURE) ×4
SUT VIC AB 1 CTX 36 (SUTURE) ×4
SUT VIC AB 1 CTX36XBRD ANBCTR (SUTURE) ×4 IMPLANT
SUT VIC AB 2-0 CT1 27 (SUTURE) ×4
SUT VIC AB 2-0 CT1 TAPERPNT 27 (SUTURE) IMPLANT
SUT VIC AB 2-0 SH 27 (SUTURE)
SUT VIC AB 2-0 SH 27XBRD (SUTURE) ×2 IMPLANT
SUT VIC AB 3-0 SH 27 (SUTURE)
SUT VIC AB 3-0 SH 27X BRD (SUTURE) IMPLANT
SUTURE FIBERWR#2 38 REV NDL BL (SUTURE) ×3 IMPLANT
SYR 30ML SLIP (SYRINGE) ×2 IMPLANT
SYR CONTROL 10ML LL (SYRINGE) ×1 IMPLANT
TOWEL OR 17X24 6PK STRL BLUE (TOWEL DISPOSABLE) ×2 IMPLANT
TOWEL OR 17X26 10 PK STRL BLUE (TOWEL DISPOSABLE) ×2 IMPLANT
TOWER CARTRIDGE SMART MIX (DISPOSABLE) IMPLANT
TRAY FOLEY CATH 14FR (SET/KITS/TRAYS/PACK) ×2 IMPLANT
WATER STERILE IRR 1000ML POUR (IV SOLUTION) ×8 IMPLANT

## 2012-07-11 NOTE — Preoperative (Signed)
Beta Blockers   Reason not to administer Beta Blockers:Not Applicable 

## 2012-07-11 NOTE — Progress Notes (Signed)
Physical Therapy Evaluation Patient Details Name: Mark Bowers MRN: [id number] DOB: 20-Dec-1945 Today's Date: 07/11/2012 Time: 4098-1191 PT Time Calculation (min): 41 min  PT Assessment / Plan / Recommendation History of Present Illness  pt presents for elective left THA, posterior approach, is WBAT. Had right hip replaced 10 yrs ago.  Clinical Impression  Pt mobilized well post op day 0, ambulating 40' with RW and min-guard A. Recommend HHPT and RW at d/c. PT will follow acutely to help pt increase strength and ROM of LLE to promote functional independence and safety for d/c home with wife.     PT Assessment  Patient needs continued PT services    Follow Up Recommendations  Home health PT;Supervision - Intermittent    Does the patient have the potential to tolerate intense rehabilitation      Barriers to Discharge        Equipment Recommendations  Rolling walker with 5" wheels    Recommendations for Other Services     Frequency 7X/week    Precautions / Restrictions Precautions Precautions: Posterior Hip Precaution Booklet Issued: Yes (comment) Precaution Comments: reviewed precautions Restrictions Weight Bearing Restrictions: Yes LLE Weight Bearing: Weight bearing as tolerated   Pertinent Vitals/Pain 4/10 left hip pain, RN gave meds      Mobility  Bed Mobility Bed Mobility: Rolling Right;Right Sidelying to Sit;Sitting - Scoot to [place] of Bed Rolling Right: 4: Min assist Right Sidelying to Sit: 4: Min assist Sitting - Scoot to Edge of Bed: 4: Min assist Details for Bed Mobility Assistance: min A to support LLE and prevent breaking of precautions Transfers Transfers: Sit to Stand;Stand to Sit Sit to Stand: 4: Min assist;From bed;With upper extremity assist Stand to Sit: 4: Min assist;To chair/3-in-1;With upper extremity assist Details for Transfer Assistance: vc's for hand and left leg placement Ambulation/Gait Ambulation/Gait Assistance: 4: Min  guard Ambulation Distance (Feet): 75 Feet Assistive device: Rolling walker Ambulation/Gait Assistance Details: vc's for sequencing and WBAT. Pt used crutches with right hip and has not used RW before Gait Pattern: Step-through pattern;Decreased stance time - left;Decreased step length - left Gait velocity: decreased Stairs: No Wheelchair Mobility Wheelchair Mobility: No    Exercises Total Joint Exercises Ankle Circles/Pumps: AROM;Both;15 reps;Seated Quad Sets: Left;AROM;10 reps;Seated Gluteal Sets: AROM;10 reps   PT Diagnosis: Abnormality of gait;Acute pain  PT Problem List: Decreased strength;Decreased range of motion;Decreased activity tolerance;Decreased mobility;Decreased knowledge of use of DME;Decreased knowledge of precautions;Pain PT Treatment Interventions: DME instruction;Gait training;Stair training;Functional mobility training;Therapeutic activities;Therapeutic exercise;Patient/family education     PT Goals(Current goals can be found in the care plan section) Acute Rehab PT Goals Patient Stated Goal: 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 Had right hip replaced 10 yrs ago.       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 Prior Function Level of Independence: Independent Comments: pt very active, bailed 100  bails of hay the weekend before surgery, needs to be reminded to slow down and let  body heal Communication Communication: No difficulties    Cognition  Cognition Arousal/Alertness: Awake/alert Behavior During Therapy: WFL for tasks assessed/performed Overall Cognitive Status: Within Functional Limits for tasks assessed    Extremity/Trunk Assessment Upper Extremity Assessment Upper Extremity Assessment: Overall WFL for tasks assessed Lower Extremity Assessment Lower Extremity Assessment: LLE deficits/detail LLE Deficits / Details: hip flex 2-/5, knee ext 3+/5, ankle WFL Cervical / Trunk Assessment Cervical / Trunk Assessment: Normal   Balance Balance Balance Assessed: Yes Static Standing Balance Static Standing - Balance Support: Bilateral upper extremity supported;During functional activity Static Standing - Level of Assistance: 5: Stand by assistance  End of Session PT - End of Session Equipment Utilized During Treatment: Gait belt Activity Tolerance: Patient tolerated treatment well Patient left: in chair;with call bell/phone within reach;with family/visitor present Nurse Communication: Mobility status  GP   Lyanne Co, PT  Acute Rehab Services  [phone]   Lyanne Co 07/11/2012, 3:19 PM

## 2012-07-11 NOTE — Transfer of Care (Signed)
Immediate Anesthesia Transfer of Care Note  Patient: Mark Bowers  Procedure(s) Performed: Procedure(s): LEFT TOTAL HIP ARTHROPLASTY (Left)  Patient Location: PACU  Anesthesia Type:General  Level of Consciousness: awake, alert , oriented and patient cooperative  Airway & Oxygen Therapy: Patient Spontanous Breathing and Patient connected to face mask oxygen  Post-op Assessment: Report given to PACU RN, Post -op Vital signs reviewed and stable and Patient moving all extremities  Post vital signs: Reviewed and stable  Complications: No apparent anesthesia complications

## 2012-07-11 NOTE — Progress Notes (Signed)
Orthopedic Tech Progress Note Patient Details:  Mark Bowers 1945/04/23 [id number]  Patient ID: Mark Bowers, male   DOB: 1945/12/04, 67 y.o.   MRN: [id number]   Mark Bowers 07/11/2012, 1:51 PM Trapeze bar

## 2012-07-11 NOTE — Progress Notes (Addendum)
ANTICOAGULATION CONSULT NOTE - Initial Consult  Pharmacy Consult for Coumadin  Indication: VTE prophylaxis  Allergies  Allergen Reactions  . Penicillins Swelling  . Sulfa Antibiotics Rash    Patient Measurements:   Dosing Weight: 130.3 kg  Vital Signs: Temp: 97.5 F (36.4 C) (07/09 1148) Temp src: Oral (07/09 0701) BP: 128/75 mmHg (07/09 1148) Pulse Rate: 85 (07/09 1148)  Labs:  Recent Labs  07/11/12 1206  HGB 14.1  HCT 41.2  PLT 188    CrCl is unknown because there is no height on file for the current visit.   Medical History: Past Medical History  Diagnosis Date  . Hypertension   . Sleep apnea     cpap      last study  2005  . Depression   . Anxiety   . Gout     Medications:  Prescriptions prior to admission  Medication Sig Dispense Refill  . colchicine 0.6 MG tablet Take 0.6 mg by mouth daily as needed (for gout).      . Naproxen Sodium (ALEVE PO) Take 1 tablet by mouth 2 (two) times daily as needed (for pain).      . rosuvastatin (CRESTOR) 20 MG tablet Take 20 mg by mouth every other day.      . spironolactone (ALDACTONE) 25 MG tablet Take 25 mg by mouth daily.      Marland Kitchen venlafaxine XR (EFFEXOR-XR) 75 MG 24 hr capsule Take 75 mg by mouth daily.        Assessment: 67yo male to start warfarin for VTE prophylaxis. Warfarin score 5. Hgb and platelets WNL. Baseline INR 0.95  Goal of Therapy:  INR 2-3 Monitor platelets by anticoagulation protocol: Yes   Plan:  Initiate warfarin 7.5 mg. Check daily INR and CBC. F/u warfarin education Monitor for signs and symptoms of bleeding. D/c lovenox when INR > 1.8  Cordel Drewes C 07/11/2012,12:32 PM

## 2012-07-11 NOTE — Anesthesia Procedure Notes (Signed)
Procedure Name: Intubation Date/Time: 07/11/2012 8:34 AM Performed by: Jerilee Hoh Pre-anesthesia Checklist: Patient identified, Emergency Drugs available, Suction available and Patient being monitored Patient Re-evaluated:Patient Re-evaluated prior to inductionOxygen Delivery Method: Circle system utilized Preoxygenation: Pre-oxygenation with 100% oxygen Intubation Type: IV induction Ventilation: Mask ventilation without difficulty and Oral airway inserted - appropriate to patient size Laryngoscope Size: Mac and 4 Grade View: Grade II Tube type: Oral Tube size: 7.5 mm Number of attempts: 1 Airway Equipment and Method: Stylet Placement Confirmation: ETT inserted through vocal cords under direct vision,  positive ETCO2 and breath sounds checked- equal and bilateral Secured at: 22 cm Tube secured with: Tape Dental Injury: Teeth and Oropharynx as per pre-operative assessment

## 2012-07-11 NOTE — Progress Notes (Signed)
UR COMPLETED  

## 2012-07-11 NOTE — Anesthesia Postprocedure Evaluation (Signed)
Anesthesia Post Note  Patient: Mark Bowers  Procedure(s) Performed: Procedure(s) (LRB): LEFT TOTAL HIP ARTHROPLASTY (Left)  Anesthesia type: General  Patient location: PACU  Post pain: Pain level controlled  Post assessment: Patient's Cardiovascular Status Stable  Last Vitals:  Filed Vitals:   07/11/12 1130  BP:   Pulse: 80  Temp:   Resp: 16    Post vital signs: Reviewed and stable  Level of consciousness: alert  Complications: No apparent anesthesia complications

## 2012-07-11 NOTE — Progress Notes (Signed)
Paged brian owen, pa a second time to clarify antibiotic order for ancef, since patient is allergic to penicillin. Also called Dr. Greig Right cell and no answer.

## 2012-07-11 NOTE — Interval H&P Note (Signed)
History and Physical Interval Note:  07/11/2012 8:09 AM  Mark Bowers  has presented today for surgery, with the diagnosis of DJD LT HIP  The various methods of treatment have been discussed with the patient and family. After consideration of risks, benefits and other options for treatment, the patient has consented to  Procedure(s): LEFT TOTAL HIP ARTHROPLASTY (Left) as a surgical intervention .  The patient's history has been reviewed, patient examined, no change in status, stable for surgery.  I have reviewed the patient's chart and labs.  Questions were answered to the patient's satisfaction.     [name] F

## 2012-07-11 NOTE — Progress Notes (Signed)
Paged Lestine Mount to clarify antibiotic order since patient is allergic to penicillin.

## 2012-07-11 NOTE — Anesthesia Preprocedure Evaluation (Addendum)
Anesthesia Evaluation  Patient identified by MRN, date of birth, ID band Patient awake    Reviewed: Allergy & Precautions, H&P , NPO status , Patient's Chart, lab work & pertinent test results, reviewed documented beta blocker date and time   Airway Mallampati: III TM Distance: <3 FB Neck ROM: full    Dental   Pulmonary sleep apnea ,  breath sounds clear to auscultation        Cardiovascular hypertension, On Medications negative cardio ROS  Rhythm:regular     Neuro/Psych PSYCHIATRIC DISORDERS negative neurological ROS     GI/Hepatic negative GI ROS, Neg liver ROS,   Endo/Other  Morbid obesity  Renal/GU negative Renal ROS  negative genitourinary   Musculoskeletal   Abdominal   Peds  Hematology negative hematology ROS (+)   Anesthesia Other Findings See surgeon's H&P   Reproductive/Obstetrics negative OB ROS                          Anesthesia Physical Anesthesia Plan  ASA: III  Anesthesia Plan: General   Post-op Pain Management:    Induction: Intravenous  Airway Management Planned: Oral ETT and Video Laryngoscope Planned  Additional Equipment:   Intra-op Plan:   Post-operative Plan: Extubation in OR  Informed Consent: I have reviewed the patients History and Physical, chart, labs and discussed the procedure including the risks, benefits and alternatives for the proposed anesthesia with the patient or authorized representative who has indicated his/her understanding and acceptance.   Dental Advisory Given  Plan Discussed with: CRNA and Surgeon  Anesthesia Plan Comments:        Anesthesia Quick Evaluation

## 2012-07-12 ENCOUNTER — Encounter (HOSPITAL_COMMUNITY): Payer: Self-pay | Admitting: General Practice

## 2012-07-12 DIAGNOSIS — M1612 Unilateral primary osteoarthritis, left hip: Secondary | ICD-10-CM | POA: Diagnosis present

## 2012-07-12 LAB — CBC
HCT: 37.4 % — ABNORMAL LOW (ref 39.0–52.0)
Hemoglobin: 12.8 g/dL — ABNORMAL LOW (ref 13.0–17.0)
MCH: 30.1 pg (ref 26.0–34.0)
MCV: 88 fL (ref 78.0–100.0)
RBC: 4.25 MIL/uL (ref 4.22–5.81)
WBC: 21.6 10*3/uL — ABNORMAL HIGH (ref 4.0–10.5)

## 2012-07-12 LAB — BASIC METABOLIC PANEL
BUN: 11 mg/dL (ref 6–23)
CO2: 22 mEq/L (ref 19–32)
Calcium: 8.7 mg/dL (ref 8.4–10.5)
Chloride: 99 mEq/L (ref 96–112)
Creatinine, Ser: 1.01 mg/dL (ref 0.50–1.35)
Glucose, Bld: 137 mg/dL — ABNORMAL HIGH (ref 70–99)

## 2012-07-12 MED ORDER — OXYCODONE-ACETAMINOPHEN 5-325 MG PO TABS: 1.0000 | ORAL_TABLET | ORAL | Status: DC | PRN

## 2012-07-12 MED ORDER — METHOCARBAMOL 500 MG PO TABS: 500.0000 mg | ORAL_TABLET | Freq: Four times a day (QID) | ORAL | Status: DC

## 2012-07-12 MED ORDER — WARFARIN SODIUM 5 MG PO TABS: 5.0000 mg | ORAL_TABLET | Freq: Every day | ORAL | Status: DC

## 2012-07-12 MED ORDER — WARFARIN SODIUM 7.5 MG PO TABS
7.5000 mg | ORAL_TABLET | Freq: Once | ORAL | Status: AC
  Administered 2012-07-12: 7.5 mg via ORAL
  Filled 2012-07-12: qty 1

## 2012-07-12 MED ORDER — ENOXAPARIN SODIUM 40 MG/0.4ML ~~LOC~~ SOLN: 40.0000 mg | SUBCUTANEOUS | Status: DC

## 2012-07-12 NOTE — Progress Notes (Signed)
Physical Therapy Treatment Patient Details Name: HARROLD FITCHETT MRN: [id number] DOB: [birth year] Today's Date: 07/12/2012 Time: 0950-1020 PT Time Calculation (min): 30 min  PT Assessment / Plan / Recommendation  PT Comments   Patient progressing well. Wants to DC home. Will see again this afternoon  Follow Up Recommendations  Home health PT;Supervision - Intermittent     Does the patient have the potential to tolerate intense rehabilitation     Barriers to Discharge        Equipment Recommendations  Rolling walker with 5" wheels    Recommendations for Other Services    Frequency 7X/week   Progress towards PT Goals Progress towards PT goals: Progressing toward goals  Plan Current plan remains appropriate    Precautions / Restrictions Precautions Precautions: Posterior Hip Precaution Booklet Issued: Yes (comment) Precaution Comments: Patient able to recall 2/3 precautions. Cues for no internal rotation Restrictions LLE Weight Bearing: Weight bearing as tolerated   Pertinent Vitals/Pain no apparent distress     Mobility  Bed Mobility Bed Mobility: Not assessed Transfers Sit to Stand: 5: Supervision;From chair/3-in-1;With upper extremity assist Stand to Sit: 5: Supervision;To chair/3-in-1;With upper extremity assist Ambulation/Gait Ambulation/Gait Assistance: 5: Supervision Ambulation Distance (Feet): 500 Feet Assistive device: Rolling walker Ambulation/Gait Assistance Details: working on step through pattern but not quite there Gait Pattern: Step-to pattern Gait velocity: [facility]    Exercises Total Joint Exercises Heel Slides: AROM;Left;10 reps Hip ABduction/ADduction: AROM;Left;10 reps Long Arc Quad: AROM;Left;10 reps   PT Diagnosis:    PT Problem List:   PT Treatment Interventions:     PT Goals (current goals can now be found in the care plan section)    Visit Information  Last PT Received On: 07/12/12 Assistance Needed: +1 History of Present Illness: pt  presents for elective left THA, posterior approach, is WBAT. Had right hip replaced 10 yrs ago.    Subjective Data      Cognition  Cognition Arousal/Alertness: Awake/alert Behavior During Therapy: WFL for tasks assessed/performed Overall Cognitive Status: Within Functional Limits for tasks assessed    Balance     End of Session PT - End of Session Equipment Utilized During Treatment: Gait belt Activity Tolerance: Patient tolerated treatment well Patient left: in chair;with call bell/phone within reach;with family/visitor present Nurse Communication: Mobility status   GP     Fredrich Birks 07/12/2012, 10:29 AM 07/12/2012 Fredrich Birks PTA [phone] pager [phone] office

## 2012-07-12 NOTE — Care Management Note (Signed)
    Page 1 of 2   07/12/2012     1:13:46 PM   CARE MANAGEMENT NOTE 07/12/2012  Patient:  DEMETREUS, LOTHAMER   Account Number:  [id number]  Date Initiated:  07/11/2012  Documentation initiated by:  [facility]  Subjective/Objective Assessment:   admitted postop left total hip arthroplasty     Action/Plan:   plan return home with HHPT, HHRN   Anticipated DC Date:  07/14/2012   Anticipated DC Plan:  HOME W HOME HEALTH SERVICES      DC Planning Services  CM consult      Choice offered to / List presented to:     DME arranged  3-N-1  [facility]      DME agency  [employer]     HH arranged  HH-2 PT  HH-1 RN      [facility] agency  [facility]   Status of service:  Completed, signed off Medicare Important Message given?   (If response is "NO", the following Medicare IM given date fields will be blank) Date Medicare IM given:   Date Additional Medicare IM given:    Discharge Disposition:  HOME W HOME HEALTH SERVICES  Per UR Regulation:    If discussed at Long Length of Stay Meetings, dates discussed:    Comments:  07/12/12 Patient set up with HHPT with [facility] by MD office. Contacted Kierre Deines with [facility] and added HHRN due to d/c with Coumadin. Spoke with patient, no change in d/c plan. Jacquelynn Cree RN, BSN, CCM

## 2012-07-12 NOTE — Progress Notes (Signed)
Physical Therapy Treatment Patient Details Name: Mark Bowers MRN: [id number] DOB: Oct 24, 1945 Today's Date: 07/12/2012 Time: 1310-1330 PT Time Calculation (min): 20 min  PT Assessment / Plan / Recommendation  PT Comments   Patient is making good progress with PT.  From a mobility standpoint anticipate patient will be ready for DC home this afternoon if cleared by MD .     Follow Up Recommendations  Home health PT;Supervision - Intermittent     Does the patient have the potential to tolerate intense rehabilitation     Barriers to Discharge        Equipment Recommendations  Rolling walker with 5" wheels    Recommendations for Other Services    Frequency 7X/week   Progress towards PT Goals Progress towards PT goals: Progressing toward goals  Plan Current plan remains appropriate    Precautions / Restrictions Precautions Precautions: Posterior Hip Precaution Comments: Patient able to recall all precautions Restrictions LLE Weight Bearing: Weight bearing as tolerated   Pertinent Vitals/Pain no apparent distress     Mobility  Bed Mobility Bed Mobility: Not assessed Transfers Sit to Stand: 6: Modified independent (Device/Increase time) Stand to Sit: 6: Modified independent (Device/Increase time) Ambulation/Gait Ambulation/Gait Assistance: 6: Modified independent (Device/Increase time) Ambulation Distance (Feet): 500 Feet Assistive device: Rolling walker Ambulation/Gait Assistance Details: step through pattern Gait Pattern: Step-through pattern;Decreased stride length Gait velocity: WFL Stairs: Yes Stairs Assistance: 5: Supervision Stairs Assistance Details (indicate cue type and reason): practiced twice Stair Management Technique: No rails;Step to pattern;Forwards;With walker Number of Stairs: 1    Exercises     PT Diagnosis:    PT Problem List:   PT Treatment Interventions:     PT Goals (current goals can now be found in the care plan section)    Visit  Information  Last PT Received On: 07/12/12 Assistance Needed: +1 History of Present Illness: pt presents for elective left THA, posterior approach, is WBAT. Had right hip replaced 10 yrs ago.    Subjective Data      Cognition  Cognition Arousal/Alertness: Awake/alert Behavior During Therapy: WFL for tasks assessed/performed Overall Cognitive Status: Within Functional Limits for tasks assessed    Balance     End of Session PT - End of Session Equipment Utilized During Treatment: Gait belt Activity Tolerance: Patient tolerated treatment well Patient left: in chair;with call bell/phone within reach;with family/visitor present Nurse Communication: Mobility status   GP     Fredrich Birks 07/12/2012, 2:55 PM 07/12/2012 Fredrich Birks PTA [phone] pager [phone] office

## 2012-07-12 NOTE — Progress Notes (Signed)
ANTICOAGULATION CONSULT NOTE - Follow Up Consult  Pharmacy Consult for Coumadin  Indication: VTE prophylaxis  Allergies  Allergen Reactions  . Penicillins Swelling  . Sulfa Antibiotics Rash    Patient Measurements:   Dosing Weight: 130.3 kg  Vital Signs: Temp: 98.3 F (36.8 C) (07/10 0642) BP: 126/71 mmHg (07/10 0642) Pulse Rate: 100 (07/10 0642)  Labs:  Recent Labs  07/11/12 1206 07/12/12 0514  HGB 14.1 12.8*  HCT 41.2 37.4*  PLT 188 187  LABPROT  --  14.0  INR  --  1.10  CREATININE 1.10 1.01    CrCl is unknown because there is no height on file for the current visit.   Medical History: Past Medical History  Diagnosis Date  . Hypertension   . Sleep apnea     cpap      last study  2005  . Depression   . Anxiety   . Gout     Medications:  Prescriptions prior to admission  Medication Sig Dispense Refill  . colchicine 0.6 MG tablet Take 0.6 mg by mouth daily as needed (for gout).      . Naproxen Sodium (ALEVE PO) Take 1 tablet by mouth 2 (two) times daily as needed (for pain).      . rosuvastatin (CRESTOR) 20 MG tablet Take 20 mg by mouth every other day.      . spironolactone (ALDACTONE) 25 MG tablet Take 25 mg by mouth daily.      Marland Kitchen venlafaxine XR (EFFEXOR-XR) 75 MG 24 hr capsule Take 75 mg by mouth daily.        Assessment: 67yo male s/p left total hip replacement. Started on warfarin for VTE prophylaxis. No warfarin PTA.   Warfarin score 5. Hgb and platelets decreasing (as expected post-op but continue to monitor). Baseline INR 0.95 > INR 1.1 today. No complications noted.  Goal of Therapy:  INR 2-3 Monitor platelets by anticoagulation protocol: Yes   Plan:  Repeat warfarin 7.5 mg. Check daily INR and CBC. F/u warfarin education Monitor for signs and symptoms of bleeding. D/c lovenox when INR > 1.8 Plan for discharge tomorrow potentially  Kyomi Hector C. Bethania Schlotzhauer, PharmD Clinical Pharmacist-Resident Pager: [phone] Pharmacy:  [phone] 07/12/2012 1:44 PM

## 2012-07-12 NOTE — Op Note (Signed)
NAMEMARVON, Mark Bowers NO.:  [id number]  MEDICAL RECORD NO.:  [id number]  LOCATION:  [id number]                        FACILITY:  [facility]  PHYSICIAN:  Loreta Ave, M.D. DATE OF BIRTH:  07-23-1945  DATE OF PROCEDURE:  07/11/2012 DATE OF DISCHARGE:                              OPERATIVE REPORT   PREOPERATIVE DIAGNOSIS:  Left hip end-stage degenerative arthritis.  POSTOPERATIVE DIAGNOSIS:  Left hip end-stage degenerative arthritis.  PROCEDURE:  Left total hip replacement utilizing Stryker prosthesis. Press-fit 60 mm acetabular component.  A 40 mm internal diameter polyethylene liner.  A secure fit #9 femoral component.  A 40+ 0 BIOLOX head.  SURGEON:  Loreta Ave, M.D.  ASSISTANT:  Skip Mayer, PA, present throughout the entire case, necessary for timely completion of procedure.  ANESTHESIA:  General.  ESTIMATED BLOOD LOSS:  200 mL.  BLOOD GIVEN:  None.  SPECIMENS:  None.  CULTURES:  None.  COMPLICATION:  None.  DRESSING:  Soft compressive, abduction pillow.  PROCEDURE IN DETAIL:  The patient was brought to the operating room, placed on the operating table in supine position.  After adequate anesthesia had been obtained, turned to a lateral position.  Prepped and draped in the usual sterile fashion.  Incision along the shaft of femur extending posterosuperior.  Skin subcu tissue divided.  Iliotibial band exposed and incised.  Charnley retractor was put in place. Neurovascular structures were identified, protected throughout. External rotator capsule taken down off the back of the intertrochanteric groove of the femur.  Tagged with FiberWire.  Hip dislocated.  Femoral neck cut 1 fingerbreadth above the lesser trochanter.  Acetabulum exposed.  Marked redundant labrum debrided. Acetabulum brought up to good bleeding bone.  Sufficient inferior medial placement.  Fitted with a 60 mm cup, hammered in place at 45 degrees of abduction, 20 degrees of  anteversion.  Good capturing and fixation, augmented with 2 screws through the cup.  The 40 mm internal diameter liner.  Attention was turned to the femur.  Distal proximal reamers broaches.  Brought up to good fitting with a #9 component restoring femoral anteversion.  After appropriate trials, #9 components seated.  A 40+ 0 BIOLOX head.  The hip reduced.  Excellent stability in flexion and extension.  Equal leg lengths.  Wound irrigated.  External rotator and capsule were repaired through the intertrochanteric groove, drill holes, tied over bony bridge.  Retractor removed.  Soft tissues injected with Exparel.  Iliotibial band closed with #1 Vicryl.  Skin and subcutaneous tissue with Vicryl and staples.  Margins were injected with Marcaine.  Sterile compressive dressing applied.  Abduction pillow applied.  Anesthesia reversed.  Brought to the recovery room.  Tolerated the surgery well with no complications.     Loreta Ave, M.D.     DFM/MEDQ  D:  07/11/2012  T:  07/12/2012  Job:  [id number]

## 2012-07-12 NOTE — Progress Notes (Signed)
Occupational Therapy Evaluation Patient Details Name: Mark Bowers MRN: [id number] DOB: January 30, 1945 Today's Date: 07/12/2012 Time: 7829-5621 OT Time Calculation (min): 26 min  OT Assessment / Plan / Recommendation History of present illness Pt presents for elective left THA, posterior approach, is WBAT. Had right hip replaced 10 yrs ago.   Clinical Impression   Pt educated on DME and AE needed to follow posterior hip precautions. Wife present. Verbalized/demonstrated understanding. No further OT needs.    OT Assessment  Patient does not need any further OT services    Follow Up Recommendations  No OT follow up    Barriers to Discharge      Equipment Recommendations  3 in 1 bedside comode    Recommendations for Other Services    Frequency    eval only   Precautions / Restrictions Precautions Precautions: Posterior Hip Precaution Booklet Issued: Yes (comment) Precaution Comments: Patient able to recall all precautions Restrictions Weight Bearing Restrictions: Yes LLE Weight Bearing: Weight bearing as tolerated   Pertinent Vitals/Pain no apparent distress     ADL  Lower Body Dressing: Moderate assistance Where Assessed - Lower Body Dressing: Unsupported sit to stand Toilet Transfer: Modified independent Toilet Transfer Method: Sit to stand;Stand pivot Toilet Transfer Equipment: Bedside commode Toileting - Clothing Manipulation and Hygiene: Minimal assistance Where Assessed - Engineer, mining and Hygiene: Lean right and/or left Equipment Used: Rolling walker;Sock aid;Reacher;Long-handled sponge Transfers/Ambulation Related to ADLs: mod I ADL Comments: Educated pt on AE and DME s/p posterior hip precautions. wife present for eduction    OT Diagnosis:    OT Problem List:   OT Treatment Interventions:     OT Goals(Current goals can be found in the care plan section) Acute Rehab OT Goals Patient Stated Goal: return home and to volunteer jobs OT Goal  Formulation:  (eval only)  Visit Information  Last OT Received On: 07/12/12 Assistance Needed: +1 History of Present Illness: pt presents for elective left THA, posterior approach, is WBAT. Had right hip replaced 10 yrs ago.       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 Prior Function Level of Independence: Independent Comments: pt very active, bailed 100 bails of hay the weekend before surgery, needs to be reminded to slow down and let body heal Communication Communication: No difficulties         Vision/Perception     Cognition  Cognition Arousal/Alertness: Awake/alert Behavior During Therapy: WFL for tasks assessed/performed Overall Cognitive Status: Within Functional Limits for tasks assessed    Extremity/Trunk Assessment Upper Extremity Assessment Upper Extremity Assessment: Overall WFL for tasks assessed Lower Extremity Assessment LLE Deficits / Details: hip flex 2-/5, knee ext 3+/5, ankle WFL Cervical / Trunk Assessment Cervical / Trunk Assessment: Normal     Mobility Bed Mobility Bed Mobility: Not assessed Transfers Sit to Stand: 6: Modified independent (Device/Increase time) Stand to Sit: 6: Modified independent (Device/Increase time) Details for Transfer Assistance: vc's for hand and left leg placement     Exercise     Balance Balance Balance Assessed: Yes Static Standing Balance Static Standing - Balance Support: Bilateral upper extremity supported;During functional activity Static Standing - Level of Assistance: 5: Stand by  assistance   End of  Session OT - End of Session Equipment Utilized During Treatment: Gait belt;Rolling walker Activity Tolerance: Patient tolerated treatment well Patient left: in chair;with call bell/phone within reach;with family/visitor present Nurse Communication: Mobility status;Other (comment) (ready for D/C)  GO     Tae Robak,HILLARY 07/12/2012, 4:18 PM [facility], OTR/L  [phone] 07/12/2012

## 2012-07-12 NOTE — Discharge Summary (Signed)
Physician Discharge Summary  Patient ID: Mark Bowers MRN: [id number] DOB/AGE: 65-Nov-1947 65 y.o.  Admit date: 07/11/2012 Discharge date: 07/12/2012  Admission Diagnoses:  Discharge Diagnoses:  Principal Problem:   Osteoarthritis of left hip   Discharged Condition: good  Hospital Course: Patient admitted for left total hip; underwent same on day of admission. He received perioperative antibiotics, post operative DVT prophalaxis, and PT began the day of surgery. His course was uneventful. He will discharge to the care of his family on the PM of the first post op day, having met PT goals and eating without nausea, voiding without difficulty  Consults: None  Significant Diagnostic Studies: usual post op monitering labs unremarkable  Treatments: antibiotics: Ancef, anticoagulation: LMW heparin and warfarin and surgery: left total hip  Discharge Exam: Blood pressure 107/70, pulse 93, temperature 99.3 F (37.4 C), temperature source Oral, resp. rate 17, SpO2 97.00%. Incision/Wound:clean and dry, no sign of infection   Disposition: 01-Home or Self Care     Medication List    STOP taking these medications       ALEVE PO      TAKE these medications       colchicine 0.6 MG tablet  Take 0.6 mg by mouth daily as needed (for gout).     enoxaparin 40 MG/0.4ML injection  Commonly known as:  LOVENOX  Inject 0.4 mLs (40 mg total) into the skin daily.     methocarbamol 500 MG tablet  Commonly known as:  ROBAXIN  Take 1 tablet (500 mg total) by mouth 4 (four) times daily. If needed for spasms     oxyCODONE-acetaminophen 5-325 MG per tablet  Commonly known as:  PERCOCET/ROXICET  Take 1-2 tablets by mouth every 4 (four) hours as needed.     rosuvastatin 20 MG tablet  Commonly known as:  CRESTOR  Take 20 mg by mouth every other day.     spironolactone 25 MG tablet  Commonly known as:  ALDACTONE  Take 25 mg by mouth daily.     venlafaxine XR 75 MG 24 hr capsule  Commonly  known as:  EFFEXOR-XR  Take 75 mg by mouth daily.     warfarin 5 MG tablet  Commonly known as:  COUMADIN  Take 1 tablet (5 mg total) by mouth daily. Or as directed by home health pharmacist           Follow-up Information   Follow up with Loreta Ave, MD. (as scheduled)    Contact information:   [address] [place] [address] [phone]       Signed: AYANSH, FEUTZ 07/12/2012, 3:39 PM

## 2012-07-12 NOTE — Progress Notes (Signed)
Subjective: 1 Day Post-Op Procedure(s) (LRB): LEFT TOTAL HIP ARTHROPLASTY (Left) Patient reports pain as 4 on 0-10 scale.    Objective: Vital signs in last 24 hours: Temp:  [97.5 F (36.4 C)-98.3 F (36.8 C)] 98.3 F (36.8 C) (07/10 0642) Pulse Rate:  [78-100] 100 (07/10 0642) Resp:  [13-18] 16 (07/10 0642) BP: (118-156)/(66-87) 126/71 mmHg (07/10 0642) SpO2:  [94 %-100 %] 99 % (07/10 0642)  Intake/Output from previous day: 07/09 0701 - 07/10 0700 In: 2580 [P.O.:480; I.V.:2100] Out: 3250 [Urine:2450; Blood:800] Intake/Output this shift:     Recent Labs  07/11/12 1206 07/12/12 0514  HGB 14.1 12.8*    Recent Labs  07/11/12 1206 07/12/12 0514  WBC 20.2* 21.6*  RBC 4.67 4.25  HCT 41.2 37.4*  PLT 188 187    Recent Labs  07/11/12 1206 07/12/12 0514  NA  --  135  K  --  4.1  CL  --  99  CO2  --  22  BUN  --  11  CREATININE 1.10 1.01  GLUCOSE  --  137*  CALCIUM  --  8.7    Recent Labs  07/12/12 0514  INR 1.10    ABD soft Neurovascular intact Dorsiflexion/Plantar flexion intact Compartment soft  Assessment/Plan: 1 Day Post-Op Procedure(s) (LRB): LEFT TOTAL HIP ARTHROPLASTY (Left) Advance diet Up with therapy D/C IV fluids Plan for discharge tomorrow unless PT goals met today-will recheck this pm to see if appropriate for d/c today  Mark Bowers B 07/12/2012, 9:12 AM

## 2012-07-12 NOTE — Progress Notes (Signed)
Pt discharged to home accompanied by wife. Discharge instructions and rx given and explained and pt stated understanding. Pts IV was removed prior to leaving and his dressing was changed by PA. Pt left unit in a stable condition via wheelchair.

## 2014-11-06 ENCOUNTER — Emergency Department (HOSPITAL_COMMUNITY): Payer: Medicare Other

## 2014-11-06 ENCOUNTER — Encounter (HOSPITAL_COMMUNITY): Payer: Self-pay

## 2014-11-06 ENCOUNTER — Emergency Department (HOSPITAL_COMMUNITY)
Admission: EM | Admit: 2014-11-06 | Discharge: 2014-11-06 | Disposition: A | Discharge status: Home / Self Care | Payer: Medicare Other | Attending: Emergency Medicine | Admitting: Emergency Medicine

## 2014-11-06 DIAGNOSIS — S51811A Laceration without foreign body of right forearm, initial encounter: Secondary | ICD-10-CM | POA: Diagnosis not present

## 2014-11-06 DIAGNOSIS — Z880 Allergy status to penicillin: Secondary | ICD-10-CM | POA: Diagnosis not present

## 2014-11-06 DIAGNOSIS — M109 Gout, unspecified: Secondary | ICD-10-CM | POA: Insufficient documentation

## 2014-11-06 DIAGNOSIS — Y9289 Other specified places as the place of occurrence of the external cause: Secondary | ICD-10-CM | POA: Insufficient documentation

## 2014-11-06 DIAGNOSIS — F419 Anxiety disorder, unspecified: Secondary | ICD-10-CM | POA: Diagnosis not present

## 2014-11-06 DIAGNOSIS — Z9981 Dependence on supplemental oxygen: Secondary | ICD-10-CM | POA: Diagnosis not present

## 2014-11-06 DIAGNOSIS — W25XXXA Contact with sharp glass, initial encounter: Secondary | ICD-10-CM | POA: Insufficient documentation

## 2014-11-06 DIAGNOSIS — G4730 Sleep apnea, unspecified: Secondary | ICD-10-CM | POA: Diagnosis not present

## 2014-11-06 DIAGNOSIS — Z79899 Other long term (current) drug therapy: Secondary | ICD-10-CM | POA: Diagnosis not present

## 2014-11-06 DIAGNOSIS — Y9389 Activity, other specified: Secondary | ICD-10-CM | POA: Insufficient documentation

## 2014-11-06 DIAGNOSIS — Y998 Other external cause status: Secondary | ICD-10-CM | POA: Insufficient documentation

## 2014-11-06 DIAGNOSIS — F329 Major depressive disorder, single episode, unspecified: Secondary | ICD-10-CM | POA: Diagnosis not present

## 2014-11-06 DIAGNOSIS — I10 Essential (primary) hypertension: Secondary | ICD-10-CM | POA: Diagnosis not present

## 2014-11-06 DIAGNOSIS — S59911A Unspecified injury of right forearm, initial encounter: Secondary | ICD-10-CM | POA: Diagnosis present

## 2014-11-06 DIAGNOSIS — S51812A Laceration without foreign body of left forearm, initial encounter: Secondary | ICD-10-CM

## 2014-11-06 LAB — CBC
HCT: 45.7 % (ref 39.0–52.0)
Hemoglobin: 15.3 g/dL (ref 13.0–17.0)
MCH: 30.8 pg (ref 26.0–34.0)
MCHC: 33.5 g/dL (ref 30.0–36.0)
MCV: 92 fL (ref 78.0–100.0)
PLATELETS: 205 10*3/uL (ref 150–400)
RBC: 4.97 MIL/uL (ref 4.22–5.81)
RDW: 14.3 % (ref 11.5–15.5)
WBC: 12.3 10*3/uL — ABNORMAL HIGH (ref 4.0–10.5)

## 2014-11-06 LAB — PROTIME-INR
INR: 1.08 (ref 0.00–1.49)
PROTHROMBIN TIME: 14.2 s (ref 11.6–15.2)

## 2014-11-06 MED ORDER — HYDROCODONE-ACETAMINOPHEN 5-325 MG PO TABS: 2.0000 | ORAL_TABLET | ORAL | Status: DC | PRN

## 2014-11-06 MED ORDER — CLINDAMYCIN HCL 150 MG PO CAPS: 150.0000 mg | ORAL_CAPSULE | Freq: Three times a day (TID) | ORAL | Status: DC

## 2014-11-06 MED ORDER — LIDOCAINE-EPINEPHRINE (PF) 2 %-1:200000 IJ SOLN
INTRAMUSCULAR | Status: AC
  Filled 2014-11-06: qty 20

## 2014-11-06 MED ORDER — LIDOCAINE-EPINEPHRINE (PF) 1 %-1:200000 IJ SOLN
20.0000 mL | Freq: Once | INTRAMUSCULAR | Status: AC
  Administered 2014-11-06: 20 mL

## 2014-11-06 MED ORDER — CLINDAMYCIN HCL 150 MG PO CAPS
300.0000 mg | ORAL_CAPSULE | Freq: Once | ORAL | Status: AC
  Administered 2014-11-06: 300 mg via ORAL
  Filled 2014-11-06: qty 2

## 2014-11-06 MED ORDER — IBUPROFEN 800 MG PO TABS: 800.0000 mg | ORAL_TABLET | Freq: Three times a day (TID) | ORAL | Status: DC

## 2014-11-06 NOTE — ED Notes (Signed)
Pt reports cut r forearm on broken mirror.

## 2014-11-06 NOTE — ED Notes (Signed)
Patient with no complaints at this time. Respirations even and unlabored. Skin warm/dry. Discharge instructions reviewed with patient at this time. Patient given opportunity to voice concerns/ask questions. Patient discharged at this time and left Emergency Department with steady gait.   

## 2014-11-06 NOTE — Discharge Instructions (Signed)
Keep a clean / dry dressing on this laceration for the next 7 days.  Please obtain all of your results from medical records or have your doctors office obtain the results - share them with your doctor - you should be seen at your doctors office in the next 2 days. Call today to arrange your follow up. Take the medications as prescribed. Please review all of the medicines and only take them if you do not have an allergy to them. Please be aware that if you are taking birth control pills, taking other prescriptions, ESPECIALLY ANTIBIOTICS may make the birth control ineffective - if this is the case, either do not engage in sexual activity or use alternative methods of birth control such as condoms until you have finished the medicine and your family doctor says it is OK to restart them. If you are on a blood thinner such as COUMADIN, be aware that any other medicine that you take may cause the coumadin to either work too much, or not enough - you should have your coumadin level rechecked in next 7 days if this is the case.  ?  It is also a possibility that you have an allergic reaction to any of the medicines that you have been prescribed - Everybody reacts differently to medications and while MOST people have no trouble with most medicines, you may have a reaction such as nausea, vomiting, rash, swelling, shortness of breath. If this is the case, please stop taking the medicine immediately and contact your physician.  ?  You should return to the ER if you develop severe or worsening symptoms.

## 2014-11-06 NOTE — ED Provider Notes (Signed)
CSN: [id number]     Arrival date & time 11/06/14  1247 History   First MD Initiated Contact with Patient 11/06/14 1318     Chief Complaint  Patient presents with  . Laceration     (Consider location/radiation/quality/duration/timing/severity/associated sxs/prior Treatment) HPI Comments: The patient is 69 years old, he suffered a laceration to the right distal extensor forearm which occurred just prior to arrival when he was lifting a bag of fertilizer and as he put it down he accidentally cut his arm on a broken piece of glass. This was acute in onset, the pain is persistent, associated with a laceration and bleeding, denies numbness or weakness of the hand. Compression was applied, the patient is on Coumadin.  Patient is a 69 y.o. male presenting with skin laceration. The history is provided by the patient.  Laceration   Past Medical History  Diagnosis Date  . Hypertension   . Sleep apnea     cpap      last study  2005  . Depression   . Anxiety   . Gout    Past Surgical History  Procedure Laterality Date  . Brain surgery      beign tumor  . Joint replacement      hip  . Knee arthroscopy    . 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 Total hip arthroplasty Left 07/11/2012    Procedure: LEFT TOTAL HIP ARTHROPLASTY;  Surgeon: Ninetta Lights, MD;  Location: [facility];  Service: Orthopedics;  Laterality: Left;   No family history on file. Social History  Substance Use Topics  . Smoking status: Never Smoker   . Smokeless tobacco: Never Used  . Alcohol Use: Yes     Comment: occ    Review of Systems  Constitutional: Negative for fever.  Gastrointestinal: Negative for vomiting.  Skin: Positive for wound.       Laceration  Neurological: Negative for weakness and numbness.      Allergies  Penicillins and Sulfa antibiotics  Home Medications   Prior to Admission medications   Medication Sig Start Date End Date Taking? Authorizing Provider  colchicine 0.6 MG  tablet Take 0.6 mg by mouth daily as needed (for gout).   Yes Historical Provider, MD  methocarbamol (ROBAXIN) 500 MG tablet Take 1 tablet (500 mg total) by mouth 4 (four) times daily. If needed for spasms 07/12/12  Yes Kevante Lunt, PA-C  naproxen sodium (ANAPROX) 220 MG tablet Take 440 mg by mouth daily as needed (stiff joints).   Yes Historical Provider, MD  rosuvastatin (CRESTOR) 40 MG tablet Take 10 mg by mouth daily.   Yes Historical Provider, MD  spironolactone (ALDACTONE) 25 MG tablet Take 25 mg by mouth daily.   Yes Historical Provider, MD  testosterone cypionate (DEPOTESTOTERONE CYPIONATE) 100 MG/ML injection Inject 150 mg into the muscle every 14 (fourteen) days. For IM use only   Yes Historical Provider, MD  venlafaxine XR (EFFEXOR-XR) 75 MG 24 hr capsule Take 75 mg by mouth daily.   Yes Historical Provider, MD  clindamycin (CLEOCIN) 150 MG capsule Take 1 capsule (150 mg total) by mouth 3 (three) times daily. May dispense as 150mg  capsules 11/06/14   Noemi Chapel, MD  HYDROcodone-acetaminophen (NORCO/VICODIN) 5-325 MG tablet Take 2 tablets by mouth every 4 (four) hours as needed. 11/06/14   Noemi Chapel, MD  ibuprofen (ADVIL,MOTRIN) 800 MG tablet Take 1 tablet (800 mg total) by mouth 3 (three) times daily. 11/06/14   Aaron Edelman  Sabra Heck, MD   BP 130/74 mmHg  Pulse 79  Temp(Src) 98 F (36.7 C) (Oral)  Resp 14  Ht 5\' 10"  (1.778 m)  Wt 230 lb (104.327 kg)  BMI 33.00 kg/m2  SpO2 99% Physical Exam  Constitutional: He appears well-developed and well-nourished. No distress.  HENT:  Head: Normocephalic.  Eyes: Conjunctivae are normal. No scleral icterus.  Cardiovascular: Normal rate and regular rhythm.   Pulmonary/Chest: Effort normal and breath sounds normal.  Musculoskeletal: Normal range of motion. He exhibits tenderness ( ttp over the laceration site on the dorsum of the R forearm). He exhibits no edema.  Normal ROM of the wrist and fingers - no tendon defecits of the flexor or extensor  tendons.   Neurological: He is alert. Coordination normal.  Sensation and motor intact distal to the wound.  Skin: Skin is warm and dry. He is not diaphoretic.  Laceration located on Dorsum of the R forearm The Laceration is Linear shaped The depth is Deep - muscle The length is 6    ED Course  Procedures (including critical care time) Labs Review Labs Reviewed  CBC - Abnormal; Notable for the following:    WBC 12.3 (*)    All other components within normal limits  PROTIME-INR    Imaging Review Dg Forearm Right  11/06/2014  CLINICAL DATA:  Pain following laceration from broken mirror EXAM: RIGHT FOREARM - 2 VIEW COMPARISON:  None. FINDINGS: Frontal and lateral views were obtained. There is a bandage overlying the distal forearm. No other radiopaque foreign body appreciable. No fracture or dislocation. Joint spaces appear intact. No abnormal periosteal reaction. IMPRESSION: No bony abnormality. No radiopaque foreign body beyond overlying bandage. Electronically Signed   By: Lowella Grip III M.D.   On: 11/06/2014 13:51   I have personally reviewed and evaluated these images and lab results as part of my medical decision-making.   EKG Interpretation None      MDM   Final diagnoses:  Laceration of forearm, left, initial encounter    TDAP 2 years ago Image to r/o FB Local anesthetic, Explore and repair Pt appears well othewise - on coumadin - check INR and CBC  Laceration - repaired - bleeding controlled.  LACERATION REPAIR Performed by: Johnna Acosta Authorized by: Johnna Acosta Consent: Verbal consent obtained. Risks and benefits: risks, benefits and alternatives were discussed Consent given by: patient Patient identity confirmed: provided demographic data Prepped and Draped in normal sterile fashion Wound explored  Laceration Location: Forearm  Laceration Length: 6cm  No Foreign Bodies seen or palpated  Anesthesia: local infiltration  Local  anesthetic: lidocaine 1% with epinephrine  Anesthetic total: 10 ml  Irrigation method: syringe Amount of cleaning: standard  Skin closure: 4-0 prolene - deep fascia, running, locking, 6 staples skin  Patient tolerance: Patient tolerated the procedure well with no immediate complications.   Prophylactic abx, pain medicine and wound check in 2 days.  Recheck of dressing 1 hour after application - minimal bleeding, pt educated on wound care - stabole for d/c - recheck of cap refill and nerves and tendon function normal on recheck.  VS normal  Meds given in ED:  Medications  lidocaine-EPINEPHrine (XYLOCAINE W/EPI) 2 %-1:200000 (PF) injection (not administered)  lidocaine-EPINEPHrine (XYLOCAINE-EPINEPHrine) 1 %-1:200000 (PF) injection 20 mL (20 mLs Other Given by Other 11/06/14 1532)  clindamycin (CLEOCIN) capsule 300 mg (300 mg Oral Given 11/06/14 1632)    New Prescriptions   CLINDAMYCIN (CLEOCIN) 150 MG CAPSULE    Take 1 capsule (  150 mg total) by mouth 3 (three) times daily. May dispense as 150mg  capsules   HYDROCODONE-ACETAMINOPHEN (NORCO/VICODIN) 5-325 MG TABLET    Take 2 tablets by mouth every 4 (four) hours as needed.   IBUPROFEN (ADVIL,MOTRIN) 800 MG TABLET    Take 1 tablet (800 mg total) by mouth 3 (three) times daily.      Noemi Chapel, MD 11/06/14 [phone]

## 2015-01-19 ENCOUNTER — Ambulatory Visit (INDEPENDENT_AMBULATORY_CARE_PROVIDER_SITE_OTHER): Payer: Medicare Other | Admitting: Neurology

## 2015-01-19 ENCOUNTER — Encounter: Payer: Self-pay | Admitting: Neurology

## 2015-01-19 VITALS — BP 132/79 | HR 65 | Ht 70.0 in | Wt 235.5 lb

## 2015-01-19 DIAGNOSIS — H8110 Benign paroxysmal vertigo, unspecified ear: Secondary | ICD-10-CM

## 2015-01-19 DIAGNOSIS — D432 Neoplasm of uncertain behavior of brain, unspecified: Secondary | ICD-10-CM | POA: Diagnosis not present

## 2015-01-19 HISTORY — DX: Benign paroxysmal vertigo, unspecified ear: H81.10

## 2015-01-19 HISTORY — DX: Neoplasm of uncertain behavior of brain, unspecified: D43.2

## 2015-01-19 NOTE — Patient Instructions (Addendum)
   We will check MRI of the brain, and consider vestibular rehabilitation for the vertigo.   Vertigo Vertigo means you feel like you or your surroundings are moving when they are not. Vertigo can be dangerous if it occurs when you are at work, driving, or performing difficult activities.  CAUSES  Vertigo occurs when there is a conflict of signals sent to your brain from the visual and sensory systems in your body. There are many different causes of vertigo, including:  Infections, especially in the inner ear.  A bad reaction to a drug or misuse of alcohol and medicines.  Withdrawal from drugs or alcohol.  Rapidly changing positions, such as lying down or rolling over in bed.  A migraine headache.  Decreased blood flow to the brain.  Increased pressure in the brain from a head injury, infection, tumor, or bleeding. SYMPTOMS  You may feel as though the world is spinning around or you are falling to the ground. Because your balance is upset, vertigo can cause nausea and vomiting. You may have involuntary eye movements (nystagmus). DIAGNOSIS  Vertigo is usually diagnosed by physical exam. If the cause of your vertigo is unknown, your caregiver may perform imaging tests, such as an MRI scan (magnetic resonance imaging). TREATMENT  Most cases of vertigo resolve on their own, without treatment. Depending on the cause, your caregiver may prescribe certain medicines. If your vertigo is related to body position issues, your caregiver may recommend movements or procedures to correct the problem. In rare cases, if your vertigo is caused by certain inner ear problems, you may need surgery. HOME CARE INSTRUCTIONS   Follow your caregiver's instructions.  Avoid driving.  Avoid operating heavy machinery.  Avoid performing any tasks that would be dangerous to you or others during a vertigo episode.  Tell your caregiver if you notice that certain medicines seem to be causing your vertigo. Some of  the medicines used to treat vertigo episodes can actually make them worse in some people. SEEK IMMEDIATE MEDICAL CARE IF:   Your medicines do not relieve your vertigo or are making it worse.  You develop problems with talking, walking, weakness, or using your arms, hands, or legs.  You develop severe headaches.  Your nausea or vomiting continues or gets worse.  You develop visual changes.  A family member notices behavioral changes.  Your condition gets worse. MAKE SURE YOU:  Understand these instructions.  Will watch your condition.  Will get help right away if you are not doing well or get worse.   This information is not intended to replace advice given to you by your health care provider. Make sure you discuss any questions you have with your health care provider.   Document Released: 09/29/2004 Document Revised: 03/14/2011 Document Reviewed: 04/14/2014 Elsevier Interactive Patient Education [employer].

## 2015-01-19 NOTE — Progress Notes (Signed)
Reason for visit: Dizziness  Referring physician: Dr. Boykin Peek is a 70 y.o. male  History of present illness:  Mark Bowers is a 70 year old right-handed white male with a history of onset of dizziness that began in July 2016. The patient was working underneath a backhoe when he suddenly developed vertigo associated with sweats, nausea and vomiting. The symptoms lasted all day long, he went to the doctor the next day. He was placed on meclizine with some benefit. The patient has had intermittent vertigo since that time that generally occurs when he rolls to his right side while sleeping, or he is looking up and to the right while standing up. The episodes of vertigo may last about 2 minutes when he does this, associated with sweats, but no vomiting. The patient does report bilateral tinnitus, but there is no change in tinnitus or decreased hearing with the vertigo. The patient denies any overt ear pain, but he does report some pressure sensations in the ears. The patient indicates that his balance has changed, he tends to veer to the left with walking. 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 He had surgery again in 1997 and 1998. He had gamma knife treatments for the hemangioblastoma as well. The patient has not reported any recent blackout events. He reports no numbness or weakness of the face, arms, or legs. He denies any changes in speech or swallowing. He has not had any headache. He is sent to this office for further evaluation.  Past Medical History  Diagnosis Date  . Hypertension   . Sleep apnea     cpap      last study  2005  . Depression   . Anxiety   . Gout   . Brain tumor ([facility])   . Benign paroxysmal positional vertigo 01/19/2015  . 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 Brain surgery       beign tumor  . Joint replacement      hip  . Knee arthroscopy    . Total hip arthroplasty Left 07/11/2012    Dr Percell Miller  . Total hip arthroplasty Left 07/11/2012    Procedure: LEFT TOTAL HIP ARTHROPLASTY;  Surgeon: Ninetta Lights, MD;  Location: [facility];  Service: Orthopedics;  Laterality: Left;    Family History  Problem Relation Age of Onset  . Stroke Mother   . Cancer Mother     breast  . Renal Disease Father   . Cancer Brother     Social history:  reports that he has never smoked. He has never used smokeless tobacco. He reports that he drinks about 3.0 oz of alcohol per week. He reports that he does not use illicit drugs.  Medications:  Prior to Admission medications   Medication Sig Start Date End Date Taking? Authorizing Provider  colchicine 0.6 MG tablet Take 0.6 mg by mouth daily as needed (for gout).   Yes Historical Provider, MD  spironolactone (ALDACTONE) 25 MG tablet Take 25 mg by mouth daily.   Yes Historical Provider, MD  testosterone cypionate (DEPOTESTOTERONE CYPIONATE) 100 MG/ML injection Inject 150 mg into the muscle every 14 (fourteen) days. For IM use only   Yes Historical Provider, MD  venlafaxine XR (EFFEXOR-XR) 75 MG 24 hr capsule Take 75 mg by mouth daily.   Yes  Historical Provider, MD      Allergies  Allergen Reactions  . Penicillins Swelling    Has patient had a PCN reaction causing immediate rash, facial/tongue/throat swelling, SOB or lightheadedness with hypotension: no Has patient had a PCN reaction causing severe rash involving mucus membranes or skin necrosis: no Has patient had a PCN reaction that required hospitalization: NO Has patient had a PCN reaction occurring within the last 10 years: no If all of the above answers are "NO", then may proceed with Cephalosporin use.   . Sulfa Antibiotics Rash    ROS:  Out of a complete 14 system review of symptoms, the patient complains only of the following symptoms, and all other reviewed systems  are negative.  Ringing in the ears, vertigo Headache Dizziness Too much sleep  Blood pressure 132/79, pulse 65, height 5\' 10"  (1.778 m), weight 235 lb 8 oz (106.822 kg).  Physical Exam  General: The patient is alert and cooperative at the time of the examination. The patient is moderately obese.  Eyes: Pupils are equal, round, and reactive to light. Discs are flat bilaterally.  Ears: Tympanic membranes are clear.  Neck: The neck is supple, no carotid bruits are noted.  Respiratory: The respiratory examination is clear.  Cardiovascular: The cardiovascular examination reveals a regular rate and rhythm, no obvious murmurs or rubs are noted.  Skin: Extremities are without significant edema.  Neurologic Exam  Mental status: The patient is alert and oriented x 3 at the time of the examination. The patient has apparent normal recent and remote memory, with an apparently normal attention span and concentration ability.  Cranial nerves: Facial symmetry is present. There is good sensation of the face to pinprick and soft touch bilaterally. The strength of the facial muscles and the muscles to head turning and shoulder shrug are normal bilaterally. Speech is well enunciated, no aphasia or dysarthria is noted. Extraocular movements are full. Visual fields are full. The tongue is midline, and the patient has symmetric elevation of the soft palate. No obvious hearing deficits are noted.  Motor: The motor testing reveals 5 over 5 strength of all 4 extremities. Good symmetric motor tone is noted throughout.  Sensory: Sensory testing is intact to pinprick, soft touch, vibration sensation, and position sense on all 4 extremities. No evidence of extinction is noted.  Coordination: Cerebellar testing reveals good finger-nose-finger and heel-to-shin bilaterally. The Nyan-Barrany procedure was positive. The patient developed rotatory and some downbeat nystagmus when lying the head backwards. He reported  symptoms of vertigo during this time. The nystagmus lasted about 30 seconds with clearing.  Gait and station: Gait is normal. Tandem gait is minimally unsteady. Romberg is negative. No drift is seen.  Reflexes: Deep tendon reflexes are symmetric and normal bilaterally. Toes are downgoing bilaterally.   Assessment/Plan:  1. Positional vertigo  2. History of hemangioblastoma, fourth ventricle  The patient does have a history of a hemangioblastoma, this is an area that could cause vertigo. Clinically, the patient appears to have positional vertigo, but he will require further evaluation to exclude recurrence of the hemangioblastoma, or ischemic changes that have occurred following gamma knife radiation. If the MRI of the brain does not show recurrence, we may consider vestibular rehabilitation. The patient otherwise will follow-up in 4 or 5 months.  Jill Alexanders MD 01/19/2015 8:13 PM  [facility] [address] [place], [place] [address]  Phone [phone] Fax [phone]

## 2015-01-29 ENCOUNTER — Ambulatory Visit (HOSPITAL_COMMUNITY)
Admission: RE | Admit: 2015-01-29 | Discharge: 2015-01-29 | Disposition: A | Discharge status: Home / Self Care | Payer: Medicare Other | Source: Ambulatory Visit | Attending: Neurology | Admitting: Neurology

## 2015-01-29 ENCOUNTER — Telehealth: Payer: Self-pay | Admitting: Neurology

## 2015-01-29 DIAGNOSIS — R51 Headache: Secondary | ICD-10-CM | POA: Insufficient documentation

## 2015-01-29 DIAGNOSIS — D432 Neoplasm of uncertain behavior of brain, unspecified: Secondary | ICD-10-CM | POA: Diagnosis not present

## 2015-01-29 DIAGNOSIS — J018 Other acute sinusitis: Secondary | ICD-10-CM | POA: Insufficient documentation

## 2015-01-29 DIAGNOSIS — H8110 Benign paroxysmal vertigo, unspecified ear: Secondary | ICD-10-CM | POA: Diagnosis not present

## 2015-01-29 DIAGNOSIS — G8110 Spastic hemiplegia affecting unspecified side: Secondary | ICD-10-CM | POA: Diagnosis present

## 2015-01-29 LAB — POCT I-STAT CREATININE: Creatinine, Ser: 1.3 mg/dL — ABNORMAL HIGH (ref 0.61–1.24)

## 2015-01-29 MED ORDER — GADOBENATE DIMEGLUMINE 529 MG/ML IV SOLN
20.0000 mL | Freq: Once | INTRAVENOUS | Status: AC | PRN
  Administered 2015-01-29: 20 mL via INTRAVENOUS

## 2015-01-29 NOTE — Telephone Encounter (Signed)
I called the patient. The MRI of the brain is stable from 2007 evaluation. No tumor recurrence.   MRI brain 01/29/15:  IMPRESSION: 1. Stable and satisfactory postoperative appearance of the brain since 2007. Stable small 10 mm residual enhancing lesion along the left lower brainstem. 2. No new intracranial abnormality. Otherwise normal for age MRI appearance of the brain. 3. New mild paranasal sinus inflammation.

## 2015-02-03 NOTE — Telephone Encounter (Signed)
Patient called to request results of 01/29/15 MRI, states he hasn't heard from anyone.

## 2015-02-03 NOTE — Telephone Encounter (Signed)
I called the patient and explained Dr. Jannifer Franklin' message below. He verbalized understanding.

## 2015-06-19 ENCOUNTER — Ambulatory Visit: Payer: Medicare Other | Admitting: Neurology

## 2016-04-11 IMAGING — DX DG FOREARM 2V*R*
2 series · 2 of 2 positions shown · non-contrast
Comparison: None.

CLINICAL DATA: Pain following laceration from broken mirror

EXAM:
RIGHT FOREARM - 2 VIEW

[forearm ap]
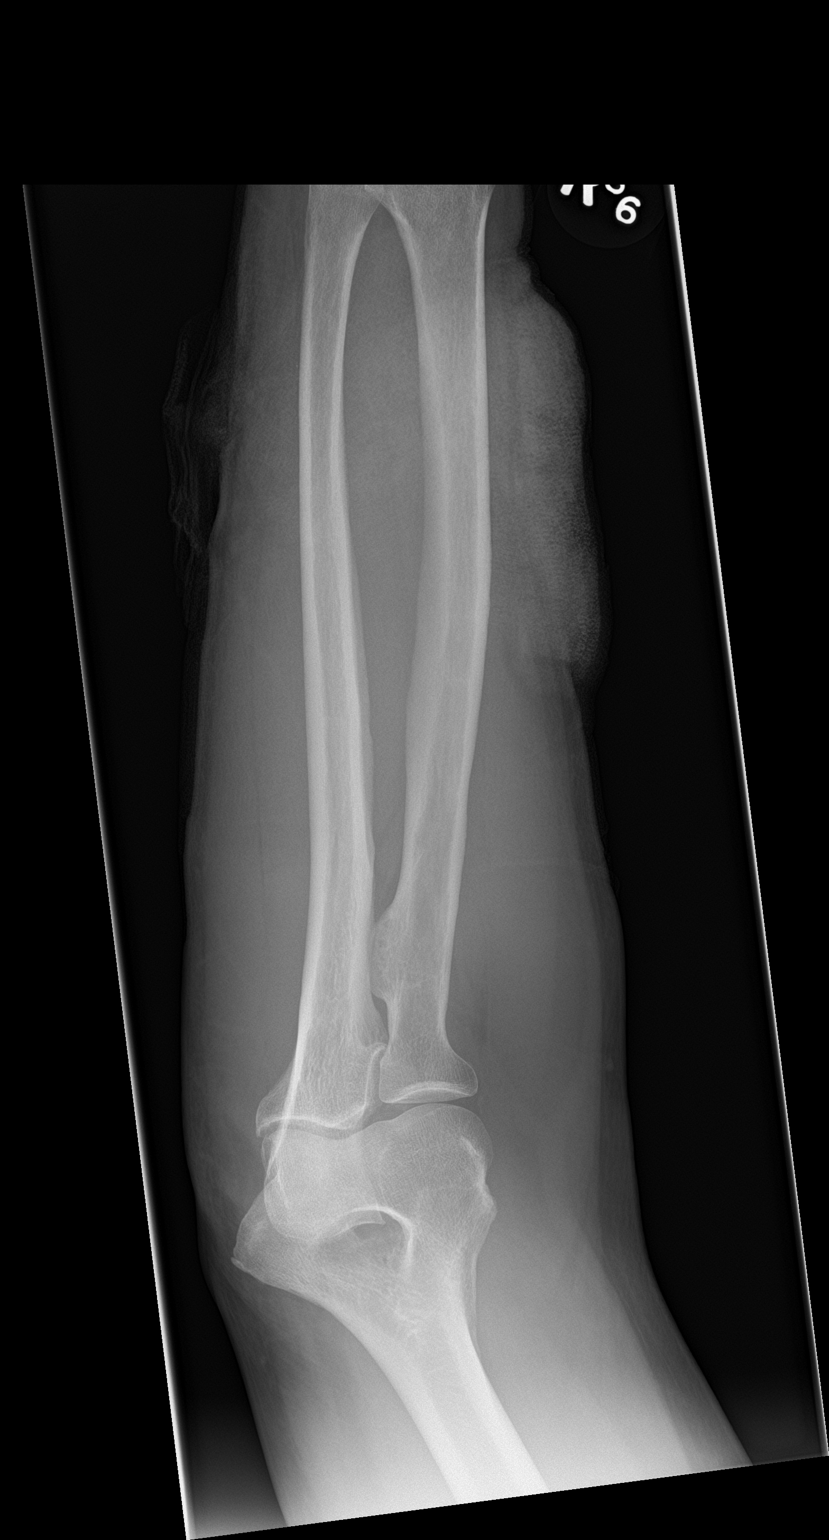

[forearm lat]
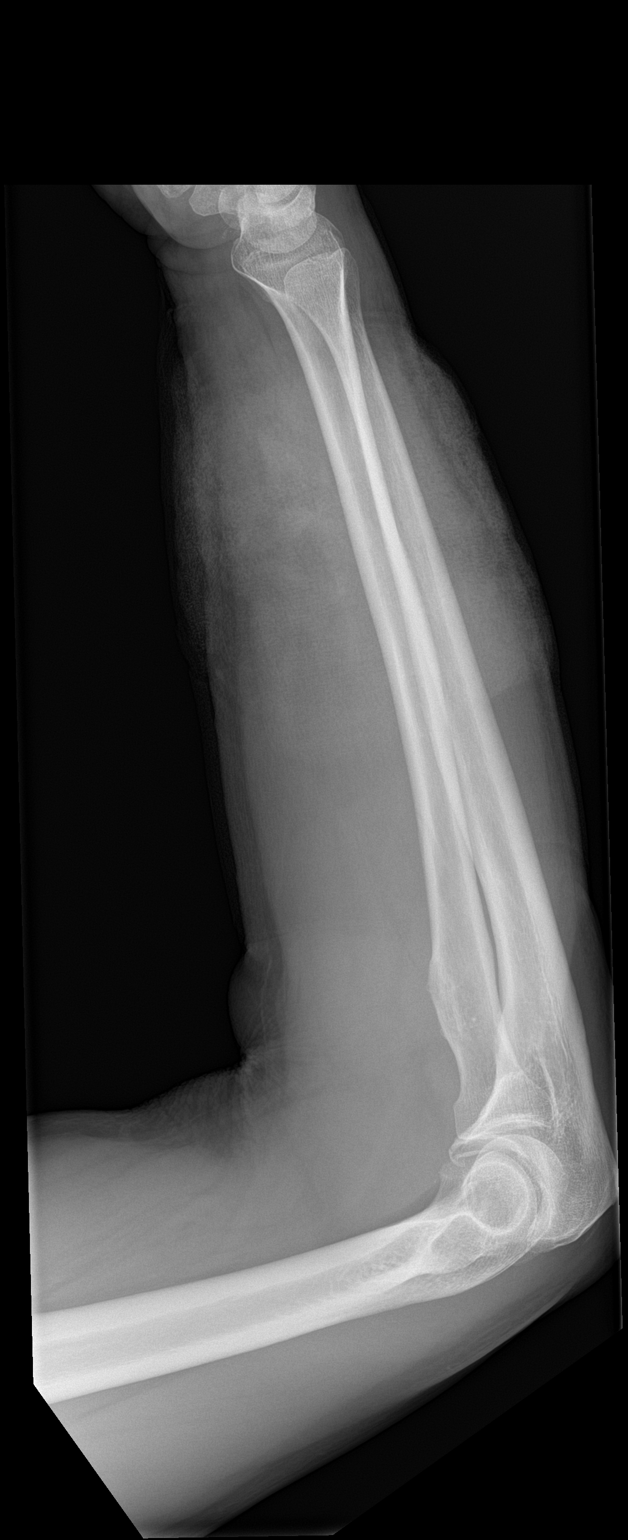

[2 of 2 positions shown; findings below may reference images not displayed]

FINDINGS: Frontal and lateral views were obtained. There is a bandage
overlying the distal forearm. No other radiopaque foreign body
appreciable. No fracture or dislocation. Joint spaces appear intact.
No abnormal periosteal reaction.
IMPRESSION: No bony abnormality. No radiopaque foreign body beyond overlying
bandage.
